# Patient Record
Sex: Female | Born: 1995 | Hispanic: Yes | Marital: Married | Smoking: Never smoker
Health system: Southern US, Community
[De-identification: ages and names within clinical notes are randomized; demographics above are authoritative.]

## PROBLEM LIST (undated history)

## (undated) DIAGNOSIS — Z862 Personal history of diseases of the blood and blood-forming organs and certain disorders involving the immune mechanism: Secondary | ICD-10-CM

## (undated) HISTORY — DX: Personal history of diseases of the blood and blood-forming organs and certain disorders involving the immune mechanism: Z86.2

## (undated) HISTORY — PX: NO PAST SURGERIES: SHX2092

---

## 2016-11-10 LAB — OB RESULTS CONSOLE HGB/HCT, BLOOD
HEMATOCRIT: 34
Hemoglobin: 11.5

## 2016-11-10 LAB — OB RESULTS CONSOLE PLATELET COUNT: Platelets: 249

## 2016-11-18 ENCOUNTER — Ambulatory Visit (INDEPENDENT_AMBULATORY_CARE_PROVIDER_SITE_OTHER): Payer: Medicaid Other | Admitting: Advanced Practice Midwife

## 2016-11-18 ENCOUNTER — Other Ambulatory Visit (HOSPITAL_COMMUNITY)
Admission: RE | Admit: 2016-11-18 | Discharge: 2016-11-18 | Disposition: A | Discharge status: Home / Self Care | Payer: Medicaid Other | Source: Ambulatory Visit | Attending: Advanced Practice Midwife | Admitting: Advanced Practice Midwife

## 2016-11-18 ENCOUNTER — Encounter: Payer: Self-pay | Admitting: Advanced Practice Midwife

## 2016-11-18 VITALS — BP 131/55 | HR 50 | Ht 65.0 in | Wt 146.0 lb

## 2016-11-18 DIAGNOSIS — O219 Vomiting of pregnancy, unspecified: Secondary | ICD-10-CM

## 2016-11-18 DIAGNOSIS — Z3401 Encounter for supervision of normal first pregnancy, first trimester: Secondary | ICD-10-CM | POA: Diagnosis not present

## 2016-11-18 DIAGNOSIS — Z3491 Encounter for supervision of normal pregnancy, unspecified, first trimester: Secondary | ICD-10-CM | POA: Diagnosis present

## 2016-11-18 DIAGNOSIS — Z3400 Encounter for supervision of normal first pregnancy, unspecified trimester: Secondary | ICD-10-CM

## 2016-11-18 DIAGNOSIS — Z862 Personal history of diseases of the blood and blood-forming organs and certain disorders involving the immune mechanism: Secondary | ICD-10-CM

## 2016-11-18 DIAGNOSIS — Z603 Acculturation difficulty: Secondary | ICD-10-CM

## 2016-11-18 DIAGNOSIS — Z3490 Encounter for supervision of normal pregnancy, unspecified, unspecified trimester: Secondary | ICD-10-CM

## 2016-11-18 DIAGNOSIS — Z789 Other specified health status: Secondary | ICD-10-CM

## 2016-11-18 MED ORDER — ONDANSETRON HCL 8 MG PO TABS: 8.0000 mg | ORAL_TABLET | Freq: Three times a day (TID) | ORAL | 3 refills | Status: DC | PRN

## 2016-11-18 NOTE — Progress Notes (Signed)
   PRENATAL VISIT NOTE  Subjective:  Megan Mckay is a 21 y.o. G1P0 at [redacted]w[redacted]d being seen today for transfer of care from [facility] for history of Henoch-Schonlein purpura. Prenatal Records reveiwed. Does nto have records for care for Henoch-Schonlein purpura. Had one episode 4 years ago in [place]. Does not think she can obtain records. Doesn't know what tests were run. She is currently monitored for the following issues for this low-risk pregnancy and has Supervision of normal first pregnancy, antepartum; Language barrier affecting health care; History of Henoch-Schonlein purpura; First pregnancy in adolescent 11 years of age or older, antepartum; and Nausea/vomiting in pregnancy on her problem list.  Patient reports nausea and vomiting. Phenergan helps, but causes too much drowsiness.  . Vag. Bleeding: None.   . Denies leaking of fluid.   The following portions of the patient's history were reviewed and updated as appropriate: allergies, current medications, past family history, past medical history, past social history, past surgical history and problem list. Problem list updated.  Objective:   Vitals:   11/18/16 0929 11/18/16 0930  BP: (!) 131/55   Pulse: (!) 50   Weight: 146 lb (66.2 kg)   Height:   (1.651 m)    Fetal Status: Fetal Heart Rate (bpm): 146 Fundal Height: 14 cm       General:  Alert, oriented and cooperative. Patient is in no acute distress.  Skin: Skin is warm and dry. No rash noted.   Cardiovascular: Normal heart rate noted  Respiratory: Normal respiratory effort, no problems with respiration noted  Abdomen: Soft, gravid, appropriate for gestational age.  Pain/Pressure: Absent     Pelvic: Cervical exam deferred        Extremities: Normal range of motion.  Edema: None  Mental Status:  Normal mood and affect. Normal behavior. Normal judgment and thought content.   Assessment and Plan:  Pregnancy: G1P0 at [redacted]w[redacted]d  1. Prenatal care, antepartum  -  Obstetric Panel, Including HIV - [place] MFM OB DETAIL +14 WK; Future - GC/Chlamydia probe amp ([facility])not at [facility] - CULTURE, URINE COMPREHENSIVE  2. Supervision of normal first pregnancy, antepartum   3. Language barrier affecting health care - Signed refusal of interpreter form. Wants to use friend for interpreting. Reminded that spanish interpreter is always available by phone/video or can be scheduled.   4. History of Henoch-Schonlein purpura AKA IgA Vasculitis.  - Per Up-to-date prognosis is usually excellent. Rarely recurs. Main long-term complication is renal disease. Pregnant women w/ Hx of IgAV have increased risk of HTN. Kidney function and BP should be monitored afterward. Pt does not think she had follow-up testing.  Creatinine at [facility]  PCR, Watch BP  5. First pregnancy in adolescent 38 years of age or older, antepartum   6. Nausea/vomiting in pregnancy  - ondansetron (ZOFRAN) 8 MG tablet; Take 1 tablet (8 mg total) by mouth every 8 (eight) hours as needed for nausea or vomiting.  Dispense: 30 tablet; Refill: 3  Preterm labor symptoms and general obstetric precautions including but not limited to vaginal bleeding, contractions, leaking of fluid and fetal movement were reviewed in detail with the patient. Please refer to After Visit Summary for other counseling recommendations.  Anatomy scan ordered.  Plan quad screen at [facility].  Return in about 4 weeks (around 12/16/2016) for ROB.   Dorathy Kinsman, CNM

## 2016-11-18 NOTE — Patient Instructions (Signed)
Crecimiento del beb durante el embarazo (How a Baby Grows During Pregnancy) El embarazo comienza cuando el semen de un hombre ingresa al vulo de una mujer (fecundacin). Esto ocurre en una de las trompas de Falopio que conecta los ovarios con el tero. Al vulo fecundado se lo denomina embrin hasta que alcanza las 10semanas. A partir de las 10semanas y hasta el momento del parto, se llama feto. El vulo fecundado se desplaza por la trompa de Falopio hasta llegar al tero y luego se implanta en el endometrio y empieza crecer. El feto en crecimiento recibe oxgeno y nutrientes a travs del torrente sanguneo de la embarazada y de los tejidos que se forman (placenta) para la sustentacin fetal. La placenta es el sistema de sustentacin de la vida del feto, proporciona la nutricin y elimina los desechos. Informarse tanto como pueda sobre el embarazo y la forma en que se desarrolla el beb puede ayudarla a disfrutar de la experiencia, y, adems, a que se d cuenta de cundo puede haber un problema y cundo hacer preguntas. CUNTO DURA UN EMBARAZO NORMAL? Generalmente, el embarazo dura 280das, o unas 40semanas. Se divide tres trimestres:  Primer trimestre: desde la semana0 a la13.  Segundo trimestre: desde la semana14 a la27.  Tercer trimestre: desde la semana28 a la40. El da que se considera que el beb est listo para nacer (a trmino) es la fecha prevista de parto. CMO SE DESARROLLA EL BEB MES A MES? Primer mes  El vulo fecundado se implanta dentro del tero.  Algunas clulas formarn la placenta, y otras formarn el feto.  Empiezan a desarrollarse los brazos, las piernas, la mdula espinal, los pulmones y el corazn.  Al final del primer mes, el corazn comienza a latir. Segundo mes  Se forman los huesos, el odo interno, los prpados, las manos y los pies.  Se desarrollan los genitales.  Al final de las 8semanas, todos los rganos importantes estn en  desarrollo. Tercer mes  Se estn formando todos los rganos internos.  Se forman los dientes debajo de las encas.  Empiezan a crecer los huesos y los msculos. La columna vertebral tiene movimiento de flexin.  La piel es transparente.  Empiezan a formarse las uas de las manos y de los pies.  Los brazos y las piernas siguen alargndose, y se desarrollan las manos y los pies.  El feto mide aproximadamente 3pulgadas (7,6cm) de largo. Cuarto mes  La placenta est totalmente formada.  Se han formado los rganos sexuales externos, el cuello, las orejas, las cejas, los prpados y las uas de las manos.  El feto puede or, tragar y mover los brazos y las piernas.  Los riones empiezan a producir orina.  La piel est recubierta por una sustancia sebcea blanca (unto sebceo) y un vello muy fino (lanugo). Quinto mes  El feto se mueve ms y es posible sentirlo por primera vez (da pataditas).  Empieza a dormir y despertarse, y tal vez comience a chuparse el dedo.  Crecen las uas en las puntas de los dedos.  Funciona el rgano del sistema digestivo que produce bilis (vescula biliar) y ayuda a digerir los nutrientes.  Si el beb es nia, tiene vulos en los ovarios. Si el beb es varn, los testculos empiezan a descender hasta el escroto. Sexto mes  Se han formado los pulmones, pero el feto an no puede respirar.  Los ojos se abren. El cerebro sigue desarrollndose.  El beb tiene huellas en los dedos de las manos y   los pies. El cabello del beb se vuelve ms abundante.  A fines del segundo trimestre, el feto mide aproximadamente 9pulgadas (22,9cm) de largo. Sptimo mes  El feto patea y se estira.  Los ojos se han desarrollado lo suficiente como para percibir los cambios de luz.  Las manos pueden hacer movimientos de prensin.  El feto responde a los ruidos. Octavo mes  Todos los rganos, as como los sistemas y aparatos del organismo, estn totalmente  desarrollados y en funcionamiento.  Los huesos se solidifican, y se desarrollan los botones gustativos. Es posible que el feto tenga hipo.  Determinadas regiones del cerebro an se estn desarrollando. El crneo sigue siendo blando. Noveno mes  El feto aumenta aproximadamente libra (230g) cada semana.  Los pulmones estn totalmente desarrollados.  Se desarrollan los hbitos de sueo.  Generalmente, el feto se acomoda con la cabeza hacia abajo (presentacin ceflica de vrtice) en el tero para prepararse para el parto. En cambio, si los glteos se acomodan en esta posicin, el beb est de nalgas.  El feto pesa entre 6 y 9libras (2,72 y 4,08kg) y mide entre 19 y 20pulgadas (48,26 a 50,8cm) de largo. QU PUEDO HACER PARA QUE EL EMBARAZO SEA SANO Y PARA AYUDAR AL BEB A DESARROLLARSE? Comida y bebida  Consuma una dieta saludable. ? Hable con el mdico para asegurarse de que est recibiendo los nutrientes que usted y el beb necesitan. ? Visite www.choosemyplate.gov para obtener ms informacin sobre cmo crear una dieta saludable.  El mdico le aconsejar cul es la cantidad saludable de peso a aumentar durante el embarazo, por lo general, entre 25 y 35libras (11 y 16kg). Puede ser necesario que: ? Aumente ms si tena bajo peso antes de quedar embarazada o si est embarazada de ms de un beb. ? Aumente menos si tena sobrepeso u obesidad cuando qued embarazada. Medicamentos y vitaminas  Tome las vitaminas prenatales como se lo haya indicado el mdico, entre ellas, cido flico, hierro, calcio y vitaminaD, que son importantes para el desarrollo saludable.  Tome los medicamentos solamente como se lo haya indicado el mdico. Lea las etiquetas y consulte al farmacutico o al mdico si puede tomar medicamentos de venta libre, suplementos y medicamentos recetados durante el embarazo. Actividades  Haga actividad fsica como se lo haya aconsejado el mdico. Pdale al mdico que  le recomiende actividades que sean seguras para usted, como caminar o practicar natacin.  No participe en deportes extremos ni extenuantes. Estilo de vida  No beba alcohol.  No consuma ningn producto que contenga tabaco, lo que incluye cigarrillos, tabaco de mascar o cigarrillos electrnicos. Si necesita ayuda para dejar de fumar, consulte al mdico.  No consuma drogas. Seguridad  No se exponga al mercurio, al plomo ni a otros metales pesados. Pregntele al mdico acerca de las fuentes comunes de estos metales pesados.  Evite la infeccin por listeria durante el embarazo. Tome las siguientes precauciones: ? No coma quesos blandos ni fiambres. ? No coma perros calientes, salvo que hayan sido calentados al punto de emitir vapor, por ejemplo, en el microondas. ? No tome leche no pasteurizada.  Evite la infeccin por toxoplasmosis durante el embarazo. Tome las siguientes precauciones: ? No cambie la arena sanitaria del gato, si tiene uno. Pdale a otra persona que lo haga por usted. ? Use guantes de jardinera mientras trabaja en el jardn. Instrucciones generales  Concurra a todas las visitas de control como se lo haya indicado el mdico. Esto es importante. Estas incluyen las visitas   de cuidado prenatal y las pruebas de deteccin.  Mantenga las enfermedades crnicas bajo control. Trabaje en estrecha colaboracin con el mdico para mantener las enfermedades bajo control, por ejemplo, la diabetes. CMO S SI EL BEB SE EST DESARROLLANDO BIEN? En cada visita de cuidado prenatal, el mdico har varios estudios diferentes para controlar su estado de salud y hacer un seguimiento del desarrollo del beb. Estos incluyen los siguientes:  Altura uterina. ? El mdico le medir el vientre en crecimiento desde la parte superior a la inferior con una cinta mtrica. ? Adems, le palpar el vientre para determinar la posicin del beb.  Latido cardaco. ? Una ecografa realizada en el primer  trimestre puede confirmar el embarazo y mostrar un latido cardaco, dependiendo del tiempo de gestacin. ? El mdico controlar la frecuencia cardaca del beb en cada visita de cuidado prenatal. ? A medida que se aproxima la fecha de parto, tal vez se hagan controles habituales de la frecuencia cardaca para garantizar que no haya sufrimiento fetal.  Ecografa del segundo trimestre. ? Esta ecografa controla el desarrollo del beb y tambin indica su sexo. QU DEBO HACER SI TENGO ALGUNA INQUIETUD RESPECTO DEL DESARROLLO DEL BEB? Hable siempre con el mdico si tiene alguna inquietud. Esta informacin no tiene como fin reemplazar el consejo del mdico. Asegrese de hacerle al mdico cualquier pregunta que tenga. Document Released: 07/23/2007 Document Revised: 05/28/2015 Document Reviewed: 07/13/2013 Elsevier Interactive Patient Education  2018 Elsevier Inc.  

## 2016-11-18 NOTE — Progress Notes (Signed)
Interpreter was present but then states she had to leave because she only had one hour to be at our clinic and patient was late arriving. Armandina Stammer RNBSN   Fetal heart rate obtained with ultrasound: 146 BPM. Armandina Stammer RNBSN

## 2016-11-18 NOTE — Addendum Note (Signed)
Addended by: Anell Barr on: 11/18/2016 04:36 PM   Modules accepted: Orders

## 2016-11-18 NOTE — Addendum Note (Signed)
Addended by: Dorathy Kinsman on: 11/18/2016 12:38 PM   Modules accepted: Orders

## 2016-11-19 ENCOUNTER — Encounter: Payer: Self-pay | Admitting: Advanced Practice Midwife

## 2016-11-19 LAB — OBSTETRIC PANEL, INCLUDING HIV
Antibody Screen: NEGATIVE
BASOS ABS: 0 10*3/uL (ref 0.0–0.2)
BASOS: 0 %
EOS (ABSOLUTE): 0 10*3/uL (ref 0.0–0.4)
EOS: 0 %
HEMATOCRIT: 33.7 % — AB (ref 34.0–46.6)
HEMOGLOBIN: 11.2 g/dL (ref 11.1–15.9)
HIV SCREEN 4TH GENERATION: NONREACTIVE
Hepatitis B Surface Ag: NEGATIVE
IMMATURE GRANULOCYTES: 0 %
Immature Grans (Abs): 0 10*3/uL (ref 0.0–0.1)
LYMPHS ABS: 2.9 10*3/uL (ref 0.7–3.1)
Lymphs: 32 %
MCH: 28.6 pg (ref 26.6–33.0)
MCHC: 33.2 g/dL (ref 31.5–35.7)
MCV: 86 fL (ref 79–97)
Monocytes Absolute: 0.5 10*3/uL (ref 0.1–0.9)
Monocytes: 5 %
NEUTROS ABS: 5.6 10*3/uL (ref 1.4–7.0)
NEUTROS PCT: 63 %
Platelets: 276 10*3/uL (ref 150–379)
RBC: 3.91 x10E6/uL (ref 3.77–5.28)
RDW: 15.2 % (ref 12.3–15.4)
RH TYPE: POSITIVE
RPR: NONREACTIVE
Rubella Antibodies, IGG: 5.07 index (ref >0.99)
WBC: 9 10*3/uL (ref 3.4–10.8)

## 2016-11-19 LAB — PROTEIN / CREATININE RATIO, URINE
Creatinine, Urine: 65.2 mg/dL
PROTEIN/CREAT RATIO: 186 mg/g{creat} (ref 0–200)
Protein, Ur: 12.1 mg/dL

## 2016-11-20 LAB — GC/CHLAMYDIA PROBE AMP (~~LOC~~) NOT AT ARMC
Chlamydia: NEGATIVE
NEISSERIA GONORRHEA: NEGATIVE

## 2016-11-26 LAB — CULTURE, URINE COMPREHENSIVE

## 2016-12-15 ENCOUNTER — Encounter (HOSPITAL_COMMUNITY): Payer: Self-pay | Admitting: Advanced Practice Midwife

## 2016-12-19 ENCOUNTER — Ambulatory Visit (INDEPENDENT_AMBULATORY_CARE_PROVIDER_SITE_OTHER): Payer: Medicaid Other | Admitting: Family Medicine

## 2016-12-19 VITALS — BP 112/65 | HR 79 | Wt 153.0 lb

## 2016-12-19 DIAGNOSIS — Z3490 Encounter for supervision of normal pregnancy, unspecified, unspecified trimester: Secondary | ICD-10-CM

## 2016-12-19 DIAGNOSIS — Z789 Other specified health status: Secondary | ICD-10-CM

## 2016-12-19 DIAGNOSIS — Z862 Personal history of diseases of the blood and blood-forming organs and certain disorders involving the immune mechanism: Secondary | ICD-10-CM

## 2016-12-19 NOTE — Progress Notes (Signed)
   PRENATAL VISIT NOTE  Subjective:  Megan Mckay is a 21 y.o. G1P0 at 5255w1d being seen today for ongoing prenatal care.  She is currently monitored for the following issues for this high-risk pregnancy and has Supervision of normal first pregnancy, antepartum; Language barrier affecting health care; History of Henoch-Schonlein purpura; First pregnancy in adolescent 20 years of age or older, antepartum; and Nausea/vomiting in pregnancy on her problem list.  Patient reports no complaints. Nausea improved.   . Vag. Bleeding: None.   . Denies leaking of fluid.   The following portions of the patient's history were reviewed and updated as appropriate: allergies, current medications, past family history, past medical history, past social history, past surgical history and problem list. Problem list updated.  Objective:   Vitals:   12/19/16 0845  BP: 112/65  Pulse: 79  Weight: 153 lb (69.4 kg)    Fetal Status: Fetal Heart Rate (bpm): 155         General:  Alert, oriented and cooperative. Patient is in no acute distress.  Skin: Skin is warm and dry. No rash noted.   Cardiovascular: Normal heart rate noted  Respiratory: Normal respiratory effort, no problems with respiration noted  Abdomen: Soft, gravid, appropriate for gestational age.  Pain/Pressure: Absent     Pelvic: Cervical exam deferred        Extremities: Normal range of motion.  Edema: None  Mental Status:  Normal mood and affect. Normal behavior. Normal judgment and thought content.   Assessment and Plan:  Pregnancy: G1P0 at 5055w1d  1. Prenatal care, antepartum FHT and FH normal. US scheduled for 11/6. - Urine Culture - AFP TETRA - Comprehensive metabolic panel  2. History of Henoch-Schonlein purpura CMP today. UPC normal.  3. Language barrier affecting health care Interpreter used  Preterm labor symptoms and general obstetric precautions including but not limited to vaginal bleeding, contractions, leaking of  fluid and fetal movement were reviewed in detail with the patient. Please refer to After Visit Summary for other counseling recommendations.  No Follow-up on file.   Levie HeritageJacob J Stinson, DO

## 2016-12-21 LAB — URINE CULTURE

## 2016-12-23 ENCOUNTER — Other Ambulatory Visit: Payer: Self-pay | Admitting: Advanced Practice Midwife

## 2016-12-23 ENCOUNTER — Ambulatory Visit (HOSPITAL_COMMUNITY)
Admission: RE | Admit: 2016-12-23 | Discharge: 2016-12-23 | Disposition: A | Discharge status: Home / Self Care | Payer: Medicaid Other | Source: Ambulatory Visit | Attending: Advanced Practice Midwife | Admitting: Advanced Practice Midwife

## 2016-12-23 ENCOUNTER — Encounter (HOSPITAL_COMMUNITY): Payer: Self-pay

## 2016-12-23 DIAGNOSIS — Z363 Encounter for antenatal screening for malformations: Secondary | ICD-10-CM | POA: Diagnosis present

## 2016-12-23 DIAGNOSIS — Z3490 Encounter for supervision of normal pregnancy, unspecified, unspecified trimester: Secondary | ICD-10-CM

## 2016-12-23 DIAGNOSIS — Z3A18 18 weeks gestation of pregnancy: Secondary | ICD-10-CM

## 2016-12-23 DIAGNOSIS — Z8489 Family history of other specified conditions: Secondary | ICD-10-CM | POA: Insufficient documentation

## 2016-12-23 LAB — AFP TETRA
DIA MOM VALUE: 0.57
DIA VALUE (EIA): 95.05 pg/mL
DSR (BY AGE) 1 IN: 1159
DSR (SECOND TRIMESTER) 1 IN: 10000
Gestational Age: 18.1 WEEKS
MATERNAL AGE AT EDD: 20.3 a
MSAFP Mom: 1.32
MSAFP: 56.8 ng/mL
MSHCG MOM: 0.72
MSHCG: 19199 m[IU]/mL
OSB RISK: 4529
T18 (By Age): 1:4517 {titer}
TEST RESULTS AFP: NEGATIVE
WEIGHT: 153 [lb_av]
uE3 Mom: 1.63
uE3 Value: 2.09 ng/mL

## 2016-12-23 LAB — COMPREHENSIVE METABOLIC PANEL
A/G RATIO: 1.4 (ref 1.2–2.2)
ALBUMIN: 4.1 g/dL (ref 3.5–5.5)
ALT: 8 IU/L (ref 0–32)
AST: 15 IU/L (ref 0–40)
Alkaline Phosphatase: 73 IU/L (ref 39–117)
BUN / CREAT RATIO: 16 (ref 9–23)
BUN: 8 mg/dL (ref 6–20)
CHLORIDE: 104 mmol/L (ref 96–106)
CO2: 21 mmol/L (ref 20–29)
Calcium: 9.4 mg/dL (ref 8.7–10.2)
Creatinine, Ser: 0.5 mg/dL — ABNORMAL LOW (ref 0.57–1.00)
GFR, EST AFRICAN AMERICAN: 162 mL/min/{1.73_m2} (ref >59)
GFR, EST NON AFRICAN AMERICAN: 141 mL/min/{1.73_m2} (ref >59)
GLOBULIN, TOTAL: 3 g/dL (ref 1.5–4.5)
Glucose: 69 mg/dL (ref 65–99)
POTASSIUM: 4.4 mmol/L (ref 3.5–5.2)
SODIUM: 140 mmol/L (ref 134–144)
TOTAL PROTEIN: 7.1 g/dL (ref 6.0–8.5)

## 2016-12-24 ENCOUNTER — Telehealth: Payer: Self-pay

## 2016-12-24 DIAGNOSIS — Z3490 Encounter for supervision of normal pregnancy, unspecified, unspecified trimester: Secondary | ICD-10-CM

## 2016-12-24 NOTE — Telephone Encounter (Signed)
Orders placed and called patient to schedule appointments. Armandina StammerJennifer Howard RNBSN

## 2016-12-25 ENCOUNTER — Other Ambulatory Visit: Payer: Self-pay

## 2016-12-25 DIAGNOSIS — Z3490 Encounter for supervision of normal pregnancy, unspecified, unspecified trimester: Secondary | ICD-10-CM

## 2016-12-25 NOTE — Progress Notes (Signed)
Needed to change MFM consult order. Armandina StammerJennifer Nikky Duba RNBSN

## 2017-01-16 ENCOUNTER — Encounter: Payer: Self-pay | Admitting: Family Medicine

## 2017-01-16 ENCOUNTER — Ambulatory Visit (INDEPENDENT_AMBULATORY_CARE_PROVIDER_SITE_OTHER): Payer: Medicaid Other | Admitting: Family Medicine

## 2017-01-16 VITALS — BP 124/60 | HR 84 | Wt 161.0 lb

## 2017-01-16 DIAGNOSIS — F329 Major depressive disorder, single episode, unspecified: Secondary | ICD-10-CM

## 2017-01-16 DIAGNOSIS — Z789 Other specified health status: Secondary | ICD-10-CM

## 2017-01-16 DIAGNOSIS — Z862 Personal history of diseases of the blood and blood-forming organs and certain disorders involving the immune mechanism: Secondary | ICD-10-CM

## 2017-01-16 DIAGNOSIS — O99340 Other mental disorders complicating pregnancy, unspecified trimester: Secondary | ICD-10-CM

## 2017-01-16 DIAGNOSIS — Z3400 Encounter for supervision of normal first pregnancy, unspecified trimester: Secondary | ICD-10-CM

## 2017-01-16 DIAGNOSIS — O99342 Other mental disorders complicating pregnancy, second trimester: Secondary | ICD-10-CM

## 2017-01-16 DIAGNOSIS — Z3402 Encounter for supervision of normal first pregnancy, second trimester: Secondary | ICD-10-CM

## 2017-01-16 DIAGNOSIS — F32A Depression, unspecified: Secondary | ICD-10-CM

## 2017-01-16 NOTE — Progress Notes (Signed)
   PRENATAL VISIT NOTE  Subjective:  Megan Mckay is a 21 y.o. G1P0 at 8425w1d being seen today for ongoing prenatal care.  She is currently monitored for the following issues for this high-risk pregnancy and has Supervision of normal first pregnancy, antepartum; Language barrier affecting health care; History of Henoch-Schonlein purpura; First pregnancy in adolescent 20 years of age or older, antepartum; and Nausea/vomiting in pregnancy on their problem list.  Patient reports feeling depressed. States that she feels sad consistently over the past couple of weeks, although not worsening. Appetite, sleeping habits normal. No SI/HI.Marland Kitchen.  Contractions: Not present. Vag. Bleeding: None.   . Denies leaking of fluid.   The following portions of the patient's history were reviewed and updated as appropriate: allergies, current medications, past family history, past medical history, past social history, past surgical history and problem list. Problem list updated.  Objective:   Vitals:   01/16/17 0932  BP: 124/60  Pulse: 84  Weight: 161 lb (73 kg)    Fetal Status: Fetal Heart Rate (bpm): 145 Fundal Height: 22 cm       General:  Alert, oriented and cooperative. Patient is in no acute distress.  Skin: Skin is warm and dry. No rash noted.   Cardiovascular: Normal heart rate noted  Respiratory: Normal respiratory effort, no problems with respiration noted  Abdomen: Soft, gravid, appropriate for gestational age.  Pain/Pressure: Absent     Pelvic: Cervical exam deferred        Extremities: Normal range of motion.  Edema: None  Mental Status:  Normal mood and affect. Normal behavior. Normal judgment and thought content.   Assessment and Plan:  Pregnancy: G1P0 at 1025w1d  1. Supervision of normal first pregnancy, antepartum FHT and FH normal  2. History of Henoch-Schonlein purpura - Consult to Maternal Fetal Care  3. Language barrier affecting health care Interpreter used  4. Depression  affecting pregnancy, antepartum Pt declined referral to integrative behavioral health. Discussed that if symptoms worsen or if she would like to see Marijean NiemannJaime, then she can call and we can get her an appointment. Patient also thinks that she does not need medication at this time. She does have family support. Also discussed that if she develops SI, then to return or call.  Preterm labor symptoms and general obstetric precautions including but not limited to vaginal bleeding, contractions, leaking of fluid and fetal movement were reviewed in detail with the patient. Please refer to After Visit Summary for other counseling recommendations.  Return in about 4 weeks (around 02/13/2017).   Levie HeritageJacob J Stinson, DO

## 2017-01-21 ENCOUNTER — Encounter (HOSPITAL_COMMUNITY): Payer: Medicaid Other

## 2017-01-21 ENCOUNTER — Ambulatory Visit (HOSPITAL_COMMUNITY): Payer: Medicaid Other

## 2017-01-23 ENCOUNTER — Ambulatory Visit (HOSPITAL_COMMUNITY): Payer: Medicaid Other

## 2017-02-11 ENCOUNTER — Ambulatory Visit (INDEPENDENT_AMBULATORY_CARE_PROVIDER_SITE_OTHER): Payer: Medicaid Other | Admitting: Family Medicine

## 2017-02-11 VITALS — BP 120/81 | HR 74 | Wt 167.0 lb

## 2017-02-11 DIAGNOSIS — M25562 Pain in left knee: Secondary | ICD-10-CM

## 2017-02-11 DIAGNOSIS — Z3400 Encounter for supervision of normal first pregnancy, unspecified trimester: Secondary | ICD-10-CM

## 2017-02-11 DIAGNOSIS — Z3402 Encounter for supervision of normal first pregnancy, second trimester: Secondary | ICD-10-CM

## 2017-02-11 NOTE — Progress Notes (Signed)
   PRENATAL VISIT NOTE  Subjective:  Megan Mckay is a 21 y.o. G1P0 at 513w6d being seen today for ongoing prenatal care.  She is currently monitored for the following issues for this high-risk pregnancy and has Supervision of normal first pregnancy, antepartum; Language barrier affecting health care; History of Henoch-Schonlein purpura; First pregnancy in adolescent 20 years of age or older, antepartum; and Nausea/vomiting in pregnancy on their problem list.  Patient reports knee pain, intermittently when walking around. Had injury to knee previously, is concerned that she is reinjuring it due to weight gain in pregnancy..  Contractions: Not present. Vag. Bleeding: None.  Movement: Present. Denies leaking of fluid.   The following portions of the patient's history were reviewed and updated as appropriate: allergies, current medications, past family history, past medical history, past social history, past surgical history and problem list. Problem list updated.  Objective:   Vitals:   02/11/17 1558  BP: 120/81  Pulse: 74  Weight: 167 lb (75.8 kg)    Fetal Status: Fetal Heart Rate (bpm): 155 Fundal Height: 26 cm Movement: Present     General:  Alert, oriented and cooperative. Patient is in no acute distress.  Skin: Skin is warm and dry. No rash noted.   Cardiovascular: Normal heart rate noted  Respiratory: Normal respiratory effort, no problems with respiration noted  Abdomen: Soft, gravid, appropriate for gestational age.  Pain/Pressure: Absent     Pelvic: Cervical exam deferred        Extremities: Normal range of motion.  Edema: None  MSK: Tenderness to patella manipulation. Knee otherwise stable to varus and valgus motions.  Mental Status:  Normal mood and affect. Normal behavior. Normal judgment and thought content.   Assessment and Plan:  Pregnancy: G1P0 at [redacted]w[redacted]d  1. Supervision of normal first pregnancy, antepartum FHT and FH normal  2. Acute pain of left knee - AMB  referral to sports medicine  Preterm labor symptoms and general obstetric precautions including but not limited to vaginal bleeding, contractions, leaking of fluid and fetal movement were reviewed in detail with the patient. Please refer to After Visit Summary for other counseling recommendations.  Return in about 4 weeks (around 03/11/2017) for 2 hr GTT, OB f/u.   Levie HeritageJacob J Zedric Deroy, DO

## 2017-02-13 ENCOUNTER — Ambulatory Visit (HOSPITAL_COMMUNITY)
Admission: RE | Admit: 2017-02-13 | Discharge: 2017-02-13 | Disposition: A | Discharge status: Home / Self Care | Payer: Medicaid Other | Source: Ambulatory Visit | Attending: Advanced Practice Midwife | Admitting: Advanced Practice Midwife

## 2017-02-13 ENCOUNTER — Encounter (HOSPITAL_COMMUNITY): Payer: Self-pay

## 2017-02-13 DIAGNOSIS — Z362 Encounter for other antenatal screening follow-up: Secondary | ICD-10-CM | POA: Insufficient documentation

## 2017-02-13 DIAGNOSIS — Z840 Family history of diseases of the skin and subcutaneous tissue: Secondary | ICD-10-CM | POA: Insufficient documentation

## 2017-02-13 DIAGNOSIS — Z363 Encounter for antenatal screening for malformations: Secondary | ICD-10-CM | POA: Insufficient documentation

## 2017-02-13 DIAGNOSIS — Z3400 Encounter for supervision of normal first pregnancy, unspecified trimester: Secondary | ICD-10-CM

## 2017-02-13 DIAGNOSIS — Z3A26 26 weeks gestation of pregnancy: Secondary | ICD-10-CM | POA: Diagnosis not present

## 2017-02-13 DIAGNOSIS — Z3490 Encounter for supervision of normal pregnancy, unspecified, unspecified trimester: Secondary | ICD-10-CM

## 2017-02-13 DIAGNOSIS — O219 Vomiting of pregnancy, unspecified: Secondary | ICD-10-CM

## 2017-02-13 DIAGNOSIS — Z862 Personal history of diseases of the blood and blood-forming organs and certain disorders involving the immune mechanism: Secondary | ICD-10-CM

## 2017-02-13 NOTE — Consult Note (Signed)
Maternal Fetal Medicine Consultation  Requesting Provider(s): [facility]  Primary OB: Stinson Reason for consultation: History of Immunoglobulin A vasculitis (Henoch-Schonlein purpura)  HPI: 21yo P0 at 26+1 weeks, referred due to a history of IgA vasculitis as a child. She had the disease at age 21 and has made a full recovery. She states the only manifestation of the disease was the cutaneous purpura and mild arthralgia. She did not have any of the gastrointestinal or renal manifestations of the disease. She has none of the markers for persistent renal disease, which is the only manifestation that affects pregnancy significantly.  OB History: OB History    Gravida Para Term Preterm AB Living   1         0   SAB TAB Ectopic Multiple Live Births                  PMH:  Past Medical History:  Diagnosis Date  . History of Henoch-Schonlein purpura     PSH: No past surgical history on file. Meds: PNV Allergies: NKDA FH: See EPIC section Soc: See EPIC section  Review of Systems: no vaginal bleeding or cramping/contractions, no LOF, no nausea/vomiting. All other systems reviewed and are negative.  PE:  VS: BP  109/67                  pulse  101             Weight 166.8 lb GEN: well-appearing female ABD: gravid, NT  Please see separate document for fetal ultrasound report.  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 Recommend continuing routine prenatal care Continue with routine prenatal care  Thank you for the opportunity to be a part of the care of Megan JunglingJennifer Mckay. Please contact our office if we can be of further assistance.   I spent approximately 15 minutes with this patient with over 50% of time spent in face-to-face counseling.

## 2017-03-13 ENCOUNTER — Ambulatory Visit (INDEPENDENT_AMBULATORY_CARE_PROVIDER_SITE_OTHER): Payer: Medicaid Other | Admitting: Family Medicine

## 2017-03-13 VITALS — BP 107/74 | HR 72 | Wt 170.0 lb

## 2017-03-13 DIAGNOSIS — Z3490 Encounter for supervision of normal pregnancy, unspecified, unspecified trimester: Secondary | ICD-10-CM

## 2017-03-13 DIAGNOSIS — Z3400 Encounter for supervision of normal first pregnancy, unspecified trimester: Secondary | ICD-10-CM

## 2017-03-13 DIAGNOSIS — Z862 Personal history of diseases of the blood and blood-forming organs and certain disorders involving the immune mechanism: Secondary | ICD-10-CM

## 2017-03-13 DIAGNOSIS — G44209 Tension-type headache, unspecified, not intractable: Secondary | ICD-10-CM

## 2017-03-13 DIAGNOSIS — Z789 Other specified health status: Secondary | ICD-10-CM

## 2017-03-13 MED ORDER — CYCLOBENZAPRINE HCL 10 MG PO TABS: 5.0000 mg | ORAL_TABLET | Freq: Three times a day (TID) | ORAL | 1 refills | Status: DC | PRN

## 2017-03-13 NOTE — Progress Notes (Signed)
   PRENATAL VISIT NOTE  Subjective:  Megan Mckay is a 22 y.o. G1P0 at 5611w1d being seen today for ongoing prenatal care.  She is currently monitored for the following issues for this low-risk pregnancy and has Supervision of normal first pregnancy, antepartum; Language barrier affecting health care; History of Henoch-Schonlein purpura; First pregnancy in adolescent 21 years of age or older, antepartum; and Nausea/vomiting in pregnancy on their problem list.  Patient reports headache - gets them twice a week, usually in the morning. Improves with rest and tylenol. Seems to come on when she has stress. Contractions: Not present. Vag. Bleeding: None.  Movement: Present. Denies leaking of fluid.   The following portions of the patient's history were reviewed and updated as appropriate: allergies, current medications, past family history, past medical history, past social history, past surgical history and problem list. Problem list updated.  Objective:   Vitals:   03/13/17 0909  BP: 107/74  Pulse: 72  Weight: 170 lb (77.1 kg)    Fetal Status:     Movement: Present     General:  Alert, oriented and cooperative. Patient is in no acute distress.  Skin: Skin is warm and dry. No rash noted.   Cardiovascular: Normal heart rate noted  Respiratory: Normal respiratory effort, no problems with respiration noted  Abdomen: Soft, gravid, appropriate for gestational age.  Pain/Pressure: Absent     Pelvic: Cervical exam deferred        Extremities: Normal range of motion.  Edema: None  Mental Status:  Normal mood and affect. Normal behavior. Normal judgment and thought content.   Assessment and Plan:  Pregnancy: G1P0 at 5511w1d  1. Prenatal care, antepartum - Glucose Tolerance, 2 Hours w/1 Hour - RPR - HIV antibody (with reflex) - CBC  2. Supervision of normal first pregnancy, antepartum FHT and FH normal  3. History of Henoch-Schonlein purpura No additional testing needed  4.  Language barrier affecting health care Interpreter used  5. Tension headache Flexeril prescribed. Pt warned of somnolence - start low dose.  Preterm labor symptoms and general obstetric precautions including but not limited to vaginal bleeding, contractions, leaking of fluid and fetal movement were reviewed in detail with the patient. Please refer to After Visit Summary for other counseling recommendations.  Return in about 2 weeks (around 03/27/2017).   Levie HeritageJacob J Stinson, DO

## 2017-03-14 LAB — CBC
HEMATOCRIT: 28.1 % — AB (ref 34.0–46.6)
Hemoglobin: 9.2 g/dL — ABNORMAL LOW (ref 11.1–15.9)
MCH: 29.5 pg (ref 26.6–33.0)
MCHC: 32.7 g/dL (ref 31.5–35.7)
MCV: 90 fL (ref 79–97)
Platelets: 305 10*3/uL (ref 150–379)
RBC: 3.12 x10E6/uL — ABNORMAL LOW (ref 3.77–5.28)
RDW: 13.7 % (ref 12.3–15.4)
WBC: 9.1 10*3/uL (ref 3.4–10.8)

## 2017-03-14 LAB — GLUCOSE TOLERANCE, 2 HOURS W/ 1HR
Glucose, 1 hour: 96 mg/dL (ref 65–179)
Glucose, 2 hour: 67 mg/dL (ref 65–152)
Glucose, Fasting: 69 mg/dL (ref 65–91)

## 2017-03-14 LAB — RPR: RPR Ser Ql: NONREACTIVE

## 2017-03-14 LAB — HIV ANTIBODY (ROUTINE TESTING W REFLEX): HIV SCREEN 4TH GENERATION: NONREACTIVE

## 2017-03-16 ENCOUNTER — Other Ambulatory Visit: Payer: Self-pay | Admitting: Family Medicine

## 2017-03-16 ENCOUNTER — Telehealth: Payer: Self-pay

## 2017-03-16 DIAGNOSIS — Z3400 Encounter for supervision of normal first pregnancy, unspecified trimester: Secondary | ICD-10-CM

## 2017-03-16 MED ORDER — FERROUS GLUCONATE 324 (38 FE) MG PO TABS: 324.0000 mg | ORAL_TABLET | Freq: Two times a day (BID) | ORAL | 3 refills | Status: DC

## 2017-03-16 NOTE — Telephone Encounter (Signed)
-----   Message from Levie HeritageJacob J Stinson, DO sent at 03/16/2017  8:28 AM EST ----- Patient quite anemic - needs to take iron twice a day. Prescription sent to pharmacy.

## 2017-03-16 NOTE — Telephone Encounter (Signed)
Attempted to reach patient. Voicemail not set up. Armandina StammerJennifer Cherilyn Sautter RN

## 2017-03-19 NOTE — Telephone Encounter (Signed)
Attempt to contact pt.  No answer, VM not set up. 

## 2017-03-23 NOTE — Telephone Encounter (Signed)
Patient was seen in office and made aware of anemia and need for prescription. Patient states understanding through interpreter. Megan StammerJennifer Raekwan Spelman RNBSN

## 2017-03-25 ENCOUNTER — Encounter: Payer: Self-pay | Admitting: Obstetrics & Gynecology

## 2017-03-25 ENCOUNTER — Ambulatory Visit (INDEPENDENT_AMBULATORY_CARE_PROVIDER_SITE_OTHER): Payer: Medicaid Other | Admitting: Obstetrics & Gynecology

## 2017-03-25 VITALS — BP 104/59 | HR 92 | Wt 174.0 lb

## 2017-03-25 DIAGNOSIS — Z3400 Encounter for supervision of normal first pregnancy, unspecified trimester: Secondary | ICD-10-CM

## 2017-03-25 DIAGNOSIS — Z862 Personal history of diseases of the blood and blood-forming organs and certain disorders involving the immune mechanism: Secondary | ICD-10-CM

## 2017-03-25 DIAGNOSIS — Z789 Other specified health status: Secondary | ICD-10-CM

## 2017-03-25 NOTE — Patient Instructions (Signed)

## 2017-03-25 NOTE — Progress Notes (Signed)
Interpreter [phone] used via Ipad. Armandina StammerJennifer Howard RN BSN

## 2017-03-25 NOTE — Progress Notes (Signed)
   PRENATAL VISIT NOTE  Subjective:  Megan JunglingJennifer Mckay is a 22 y.o. G1P0 at 3433w6d being seen today for ongoing prenatal care.  She is currently monitored for the following issues for this low-risk pregnancy and has Supervision of normal first pregnancy, antepartum; Language barrier affecting health care; History of Henoch-Schonlein purpura; First pregnancy in adolescent 21 years of age or older, antepartum; and Nausea/vomiting in pregnancy on their problem list.  Patient reports no complaints.  Contractions: Not present. Vag. Bleeding: None.  Movement: Present. Denies leaking of fluid.   The following portions of the patient's history were reviewed and updated as appropriate: allergies, current medications, past family history, past medical history, past social history, past surgical history and problem list. Problem list updated.  Objective:   Vitals:   03/25/17 1540  BP: (!) 104/59  Pulse: 92  Weight: 174 lb (78.9 kg)    Fetal Status: Fetal Heart Rate (bpm): 150   Movement: Present     General:  Alert, oriented and cooperative. Patient is in no acute distress.  Skin: Skin is warm and dry. No rash noted.   Cardiovascular: Normal heart rate noted  Respiratory: Normal respiratory effort, no problems with respiration noted  Abdomen: Soft, gravid, appropriate for gestational age.  Pain/Pressure: Absent     Pelvic: Cervical exam deferred        Extremities: Normal range of motion.  Edema: None  Mental Status:  Normal mood and affect. Normal behavior. Normal judgment and thought content.   Assessment and Plan:  Pregnancy: G1P0 at 6233w6d  1. Supervision of normal first pregnancy, antepartum  2. Language barrier affecting health care Entire visit conducted with a video interpreter.   3. History of Henoch-Schonlein purpura See problem list. No f/u needed  Preterm labor symptoms and general obstetric precautions including but not limited to vaginal bleeding, contractions, leaking  of fluid and fetal movement were reviewed in detail with the patient. Please refer to After Visit Summary for other counseling recommendations.  F/u in 2 weeks or sooner prn  interpreter number [phone]  Willodean Rosenthalarolyn Harraway-Smith, MD

## 2017-04-08 ENCOUNTER — Ambulatory Visit (INDEPENDENT_AMBULATORY_CARE_PROVIDER_SITE_OTHER): Payer: Medicaid Other | Admitting: Obstetrics & Gynecology

## 2017-04-08 ENCOUNTER — Encounter: Payer: Self-pay | Admitting: Obstetrics & Gynecology

## 2017-04-08 VITALS — BP 106/56 | HR 63 | Wt 178.0 lb

## 2017-04-08 DIAGNOSIS — Z862 Personal history of diseases of the blood and blood-forming organs and certain disorders involving the immune mechanism: Secondary | ICD-10-CM | POA: Diagnosis not present

## 2017-04-08 DIAGNOSIS — Z789 Other specified health status: Secondary | ICD-10-CM

## 2017-04-08 DIAGNOSIS — Z3403 Encounter for supervision of normal first pregnancy, third trimester: Secondary | ICD-10-CM

## 2017-04-08 DIAGNOSIS — Z603 Acculturation difficulty: Secondary | ICD-10-CM

## 2017-04-08 DIAGNOSIS — O219 Vomiting of pregnancy, unspecified: Secondary | ICD-10-CM | POA: Diagnosis not present

## 2017-04-08 DIAGNOSIS — Z3400 Encounter for supervision of normal first pregnancy, unspecified trimester: Secondary | ICD-10-CM

## 2017-04-08 NOTE — Progress Notes (Signed)
   PRENATAL VISIT NOTE  Subjective:  Megan Mckay is a 22 y.o. G1P0 at 5053w6d being seen today for ongoing prenatal care.  She is currently monitored for the following issues for this low-risk pregnancy and has Supervision of normal first pregnancy, antepartum; Language barrier affecting health care; History of Henoch-Schonlein purpura; First pregnancy in adolescent 21 years of age or older, antepartum; and Nausea/vomiting in pregnancy on their problem list.  Patient reports no complaints.  Contractions: Not present.  .  Movement: Present. Denies leaking of fluid.   The following portions of the patient's history were reviewed and updated as appropriate: allergies, current medications, past family history, past medical history, past social history, past surgical history and problem list. Problem list updated.  Objective:   Vitals:   04/08/17 1535  BP: (!) 106/56  Pulse: 63  Weight: 178 lb (80.7 kg)    Fetal Status: Fetal Heart Rate (bpm): 138   Movement: Present     General:  Alert, oriented and cooperative. Patient is in no acute distress.  Skin: Skin is warm and dry. No rash noted.   Cardiovascular: Normal heart rate noted  Respiratory: Normal respiratory effort, no problems with respiration noted  Abdomen: Soft, gravid, appropriate for gestational age.  Pain/Pressure: Absent     Pelvic: Cervical exam deferred        Extremities: Normal range of motion.  Edema: None  Mental Status:  Normal mood and affect. Normal behavior. Normal judgment and thought content.   Assessment and Plan:  Pregnancy: G1P0 at 653w6d  1. Supervision of normal first pregnancy, antepartum info given on contraception management   2. Language barrier affecting health care interpretor [phone] used for entire visit  3. History of Henoch-Schonlein purpura No sx. S/p MFM consult. No additional tx needed.    Preterm labor symptoms and general obstetric precautions including but not limited to vaginal  bleeding, contractions, leaking of fluid and fetal movement were reviewed in detail with the patient. Please refer to After Visit Summary for other counseling recommendations.  Return in about 2 weeks (around 04/22/2017).  Willodean Rosenthalarolyn Harraway-Smith, MD

## 2017-04-08 NOTE — Patient Instructions (Signed)
Eleccin del mtodo anticonceptivo Contraception Choices La anticoncepcin, o los mtodos anticonceptivos, hace referencia a los mtodos o dispositivos que evitan el [place]. Mtodos hormonales Implante anticonceptivo Un implante anticonceptivo consiste en un tubo plstico delgado que contiene una hormona. Se inserta en la parte superior del brazo. Puede Consulting civil engineer por 3 aos. Inyecciones de progestina sola Las inyecciones de progestina sola contienen progestina, una forma sinttica de la hormona progesterona. Un mdico las administra cada 3 meses. Pldoras anticonceptivas Las pldoras anticonceptivas son pastillas que contienen hormonas que evitan el [place]. Deben tomarse una vez al da, preferentemente a la misma Economist. Parches anticonceptivos El parche anticonceptivo contiene hormonas que evitan el [place]. Se coloca en la piel, debe cambiarse una vez a la semana durante tres semanas y debe retirarse en la cuarta semana. Se necesita una receta para utilizar este mtodo anticonceptivo. Anillo vaginal Un anillo vaginal contiene hormonas que evitan el embarazo. Se coloca en la vagina durante tres semanas y se retira en la cuarta semana. Luego se repite el proceso con un anillo nuevo. Se necesita una receta para utilizar este mtodo anticonceptivo. Anticonceptivo de Associate Professor Los anticonceptivos de emergencia evitan el embarazo despus de Warehouse manager sexo sin proteccin. Vienen en forma de pldora y pueden tomarse hasta 5 das despus de [place]. Funcionan mejor cuando se toman lo ms pronto posible luego de [employer]. La mayora de los anticonceptivos de emergencia estn disponibles sin receta mdica. Este mtodo no debe utilizarse como el nico mtodo anticonceptivo. Mtodos de barrera Preservativo masculino Un preservativo masculino es una vaina delgada que se coloca sobre el pene durante el sexo. Los preservativos evitan que el esperma ingrese en el cuerpo de la  [place]. Pueden utilizarse con un espermicida para aumentar la efectividad. Deben desecharse luego de su uso. Preservativo femenino Un preservativo femenino es una vaina blanda y holgada que se coloca en la vagina antes de [place]. El preservativo evita que el esperma ingrese en el cuerpo de la [place]. Deben desecharse luego de su uso. Diafragma Un diafragma es una barrera blanda con forma de cpula. Se inserta en la vagina antes del sexo, junto con un espermicida. El diafragma bloquea el ingreso de esperma en el tero, y el espermicida mata a los espermatozoides. El Designer, fashion/clothing en la vagina durante 6 a 8 horas despus de Warehouse manager sexo y debe retirarse en el plazo de las 24 horas. Un diafragma es recetado y colocado por un mdico. Debe reemplazarse cada 1 a 2 aos, despus de dar a luz, de aumentar ms de 15lb (6.8kg) y de [place] plvica. Capuchn cervical Un capuchn cervical es una copa redonda y blanda de ltex o plstico que se coloca en el cuello uterino. Se inserta en la vagina antes del sexo, junto con un espermicida. Bloquea el ingreso del esperma en el tero. El capuchn Radio producer durante 6 a 8 horas despus de Warehouse manager sexo y debe retirarse en el plazo de las 48 horas. Un capuchn cervical debe ser recetado y colocado por un mdico. Debe reemplazarse cada 2aos. Esponja Una esponja es una pieza blanda y circular de espuma de poliuretano que contiene espermicida. La esponja ayuda a bloquear el ingreso de esperma en el tero, y el espermicida mata a los espermatozoides. Belva Bertin, debe humedecerla e insertarla en la vagina. Debe insertarse antes de eBay, debe permanecer dentro al menos durante 6 horas despus de [place] sexo y debe retirarse y Nurse, adult en  el plazo de las 30 horas. Espermicidas Los espermicidas son sustancias qumicas que matan o bloquean al esperma y no lo dejan ingresar al cuello uterino y al tero. Vienen en forma de crema, gel,  supositorio, espuma o comprimido. Un espermicida debe insertarse en la vagina con un aplicador al menos 10 o 15 minutos antes de tener sexo para dar tiempo a que surta [place] Eliz[place]ethefecto. El proceso debe repetirse cada vez que tenga sexo. Los espermicidas no requieren Emergency planning/management officerreceta mdica. Anticonceptivos intrauterinos Dispositivo intrauterino (DIU). Un DIU un dispositivo en forma de T que se coloca en el tero. Hay dos tipos:  DIU hormonal. Este tipo contiene progestina, una forma sinttica de la hormona progesterona. Este tipo puede permanecer colocado durante 3 a 5 aos.  DIU de cobre. Este tipo est recubierto con un alambre de cobre. Puede permanecer colocado durante 10 aos.  Mtodos anticonceptivos permanentes Ligadura de trompas en la mujer En este mtodo, se sellan, atan u obstruyen las trompas de Falopio durante una ciruga para Automotive engineerevitar que el vulo descienda Gertonhacia el tero. Esterilizacin histeroscpica En este mtodo, se coloca un implante pequeo y flexible dentro de cada trompa de Falopio. Los implantes hacen que se forme un tejido cicatricial en las trompas de Falopio y que las obstruya para que el espermatozoide no pueda llegar al vulo. El procedimiento demora alrededor de 3 meses para que sea Nobleefectivo. Debe utilizarse otro mtodo anticonceptivo durante esos 3 meses. Esterilizacin masculina Este es un procedimiento que consiste en atar los conductos que transportan el esperma (vasectoma). Luego del procedimiento, el hombre Manufacturing engineerpuede eyacular lquido (semen). Mtodos de planificacin natural Planificacin familiar natural En este mtodo, la pareja no tiene American Family Insurancesexo durante los das en que la mujer podra quedar [place]embarazada. Mtodo calendario Marketing executivesto significa realizar un seguimiento de la duracin de cada ciclo menstrual, identificar los Becton, Dickinson and Companydas en los que se puede producir un Psychiatristembarazo y no Warehouse managertener sexo durante esos Mind[place]. Mtodo de la ovulacin En este mtodo, la pareja evita tener sexo durante la  ovulacin. Mtodo sintotrmico Este mtodo implica no tener sexo durante la ovulacin. Normalmente, la mujer comprueba la ovulacin al observar cambios en su temperatura y en la consistencia del moco cervical. Mtodo posovulacin En este mtodo, la pareja espera a que finalice la ovulacin para Doctor, hospitaltener sexo. Resumen  La anticoncepcin, o los mtodos anticonceptivos, hace referencia a los mtodos o dispositivos que evitan el [place] Villageembarazo.  Los mtodos anticonceptivos hormonales incluyen implantes, inyecciones, pastillas, parches, anillos vaginales y anticonceptivos de Associate Professoremergencia.  Los mtodos anticonceptivos de barrera pueden incluir preservativos masculinos, preservativos femeninos, diafragmas, capuchones cervicales, esponjas y espermicidas.  Hay dos tipos de DIU (dispositivos intrauterinos). Un DIU puede colocarse en el tero de una mujer para evitar el embarazo durante 3 a 5 aos.  La esterilizacin permanente puede realizarse mediante un procedimiento para hombres, mujeres o ambos.  Los The Krogermtodos de Medical sales representativeplanificacin familiar natural incluyen no tener American Family Insurancesexo durante los das en que la mujer podra quedar [place] Be[place]mbarazada. Esta informacin no tiene Theme park managercomo fin reemplazar el consejo del mdico. Asegrese de hacerle al mdico cualquier pregunta que tenga. Document Released: 02/03/2005 Document Revised: 05/26/2016 Document Reviewed: 05/26/2016 Elsevier Interactive Patient Education  2018 [employer]

## 2017-04-22 ENCOUNTER — Encounter: Payer: Medicaid Other | Admitting: Obstetrics & Gynecology

## 2017-05-26 ENCOUNTER — Telehealth: Payer: Self-pay

## 2017-05-26 NOTE — Telephone Encounter (Signed)
Attempted to reach patient because she has not been seen for prenatal care since Apr 08, 2017. Patients due date was 05-21-17.  When dialing the number in chart it states "voicemailbox not set up". Armandina StammerJennifer Skyelar Swigart RN

## 2017-08-13 ENCOUNTER — Encounter (HOSPITAL_COMMUNITY): Payer: Self-pay

## 2019-07-16 IMAGING — US US MFM OB FOLLOW-UP
1 series · 14 of 28 positions shown · non-contrast
Comparison: none

[Series 1: us mfm ob follow-up · 38 acquisitions, 14 frames shown]
[im 2/38]
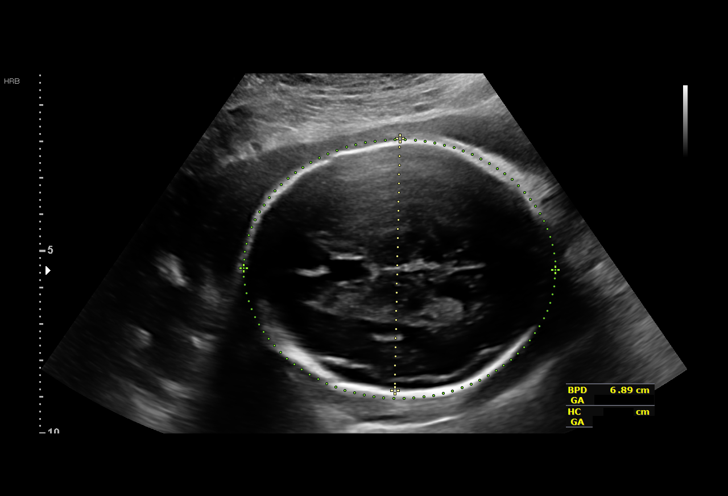
[im 5/38]
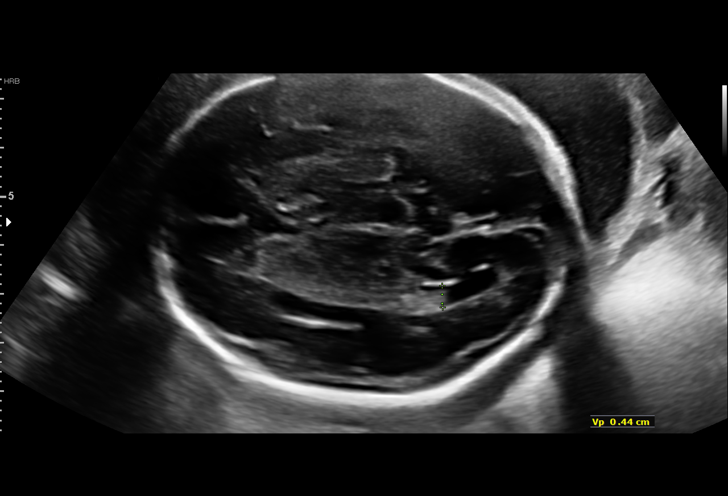
[im 7/38]
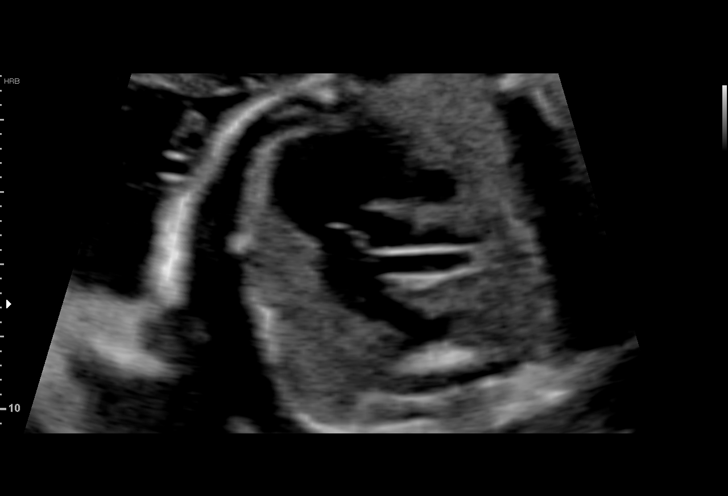
[im 10/38]
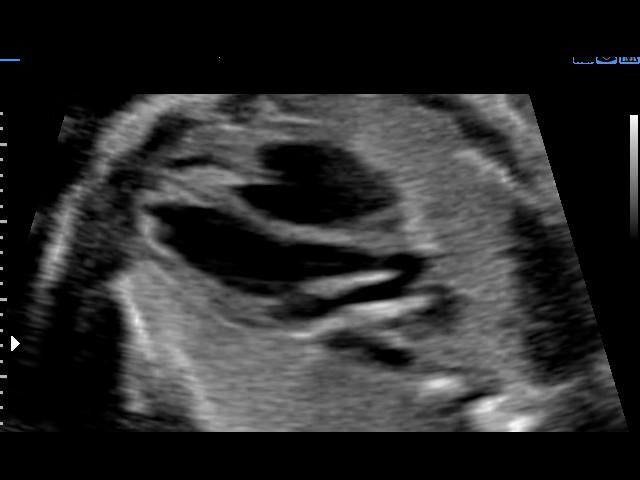
[im 13/38]
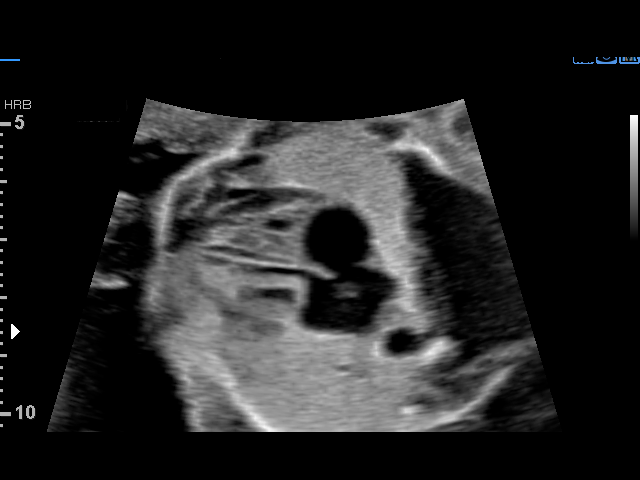
[im 16/38]
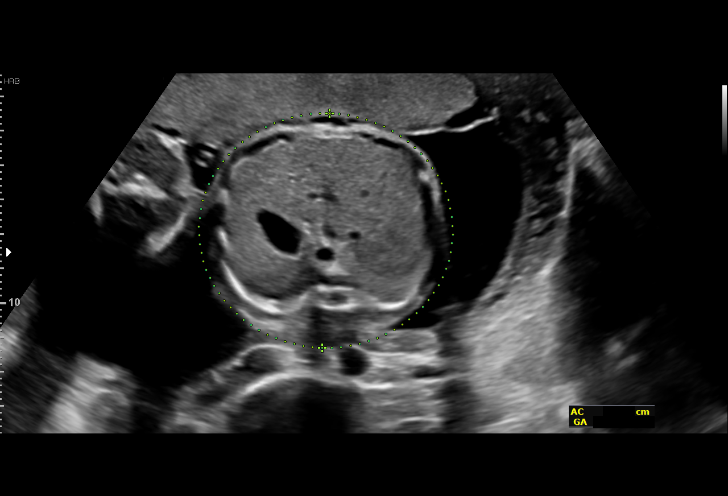
[im 18/38]
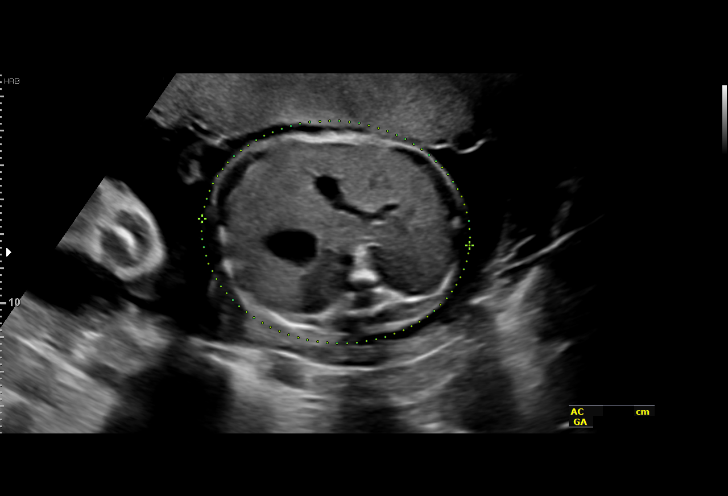
[im 21/38]
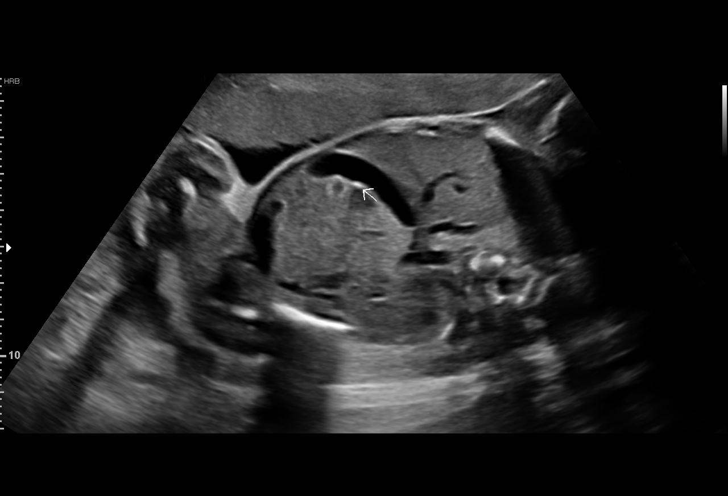
[im 24/38]
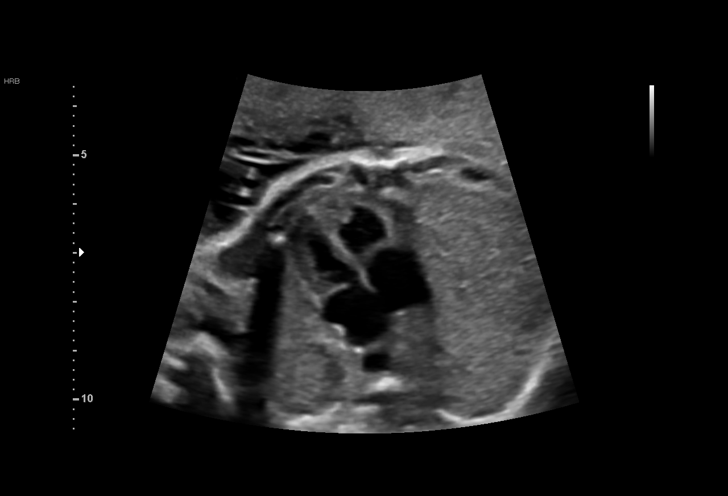
[im 27/38]
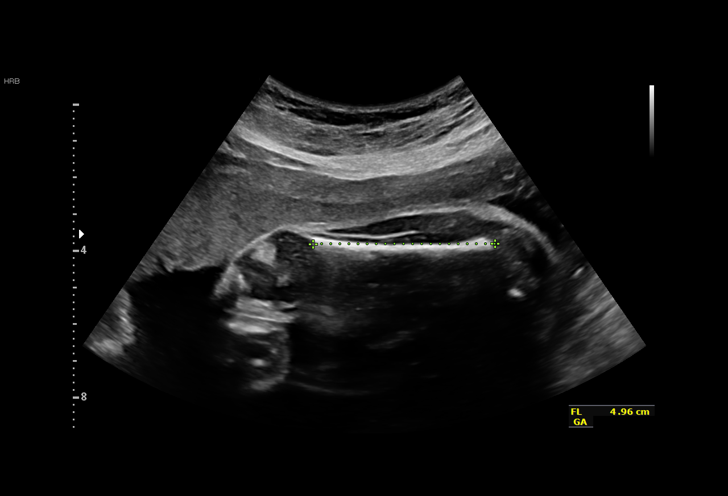
[im 29/38]
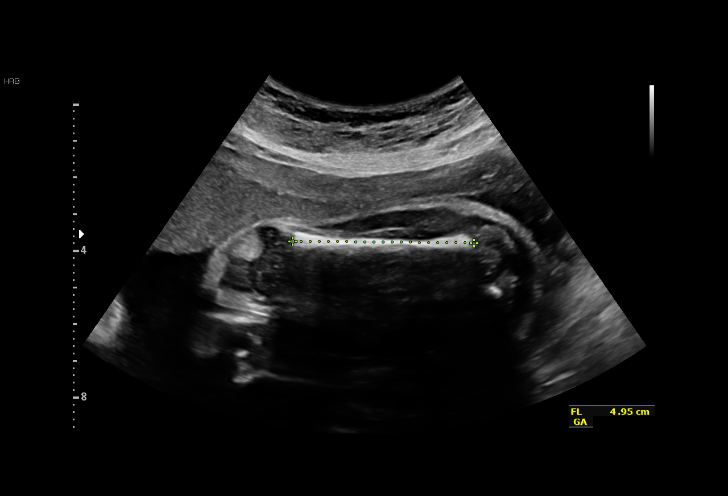
[im 32/38]
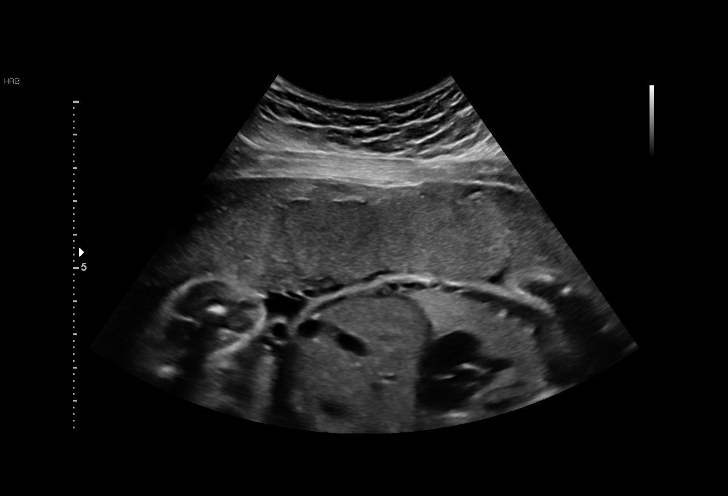
[im 35/38]
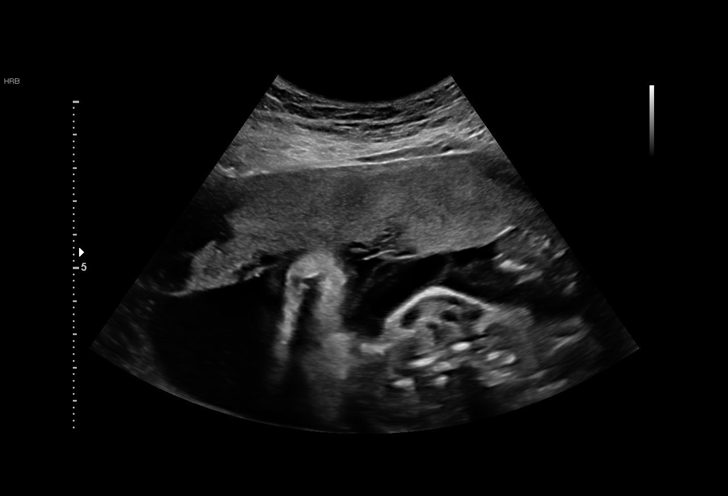
[im 38/38]
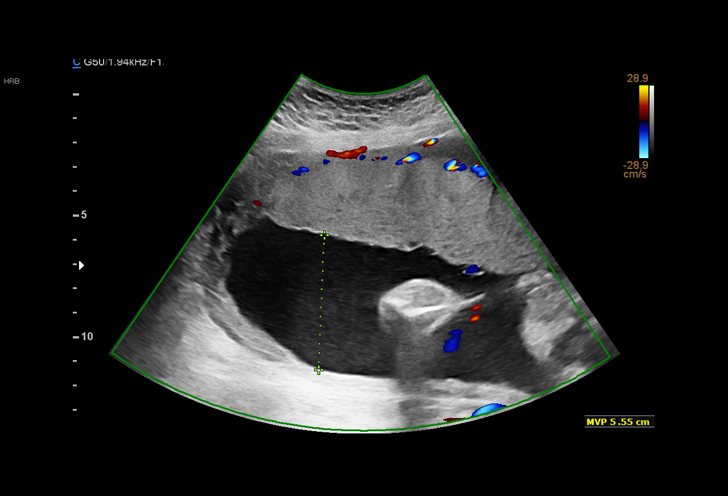

[14 of 28 positions shown; findings below may reference images not displayed]

1  ANSELIHO KRISTE           [id number]      [id number]     [id number]
Indications

26 weeks gestation of pregnancy
Encounter for antenatal screening for
malformations
Family history of genetic disorder (Filej
Mohini Galo)
OB History

Gravidity:    1
Fetal Evaluation

Num Of Fetuses:     1
Fetal Heart         149
Rate(bpm):
Cardiac Activity:   Observed
Presentation:       Cephalic
Placenta:           Anterior, above cervical os
P. Cord Insertion:  Previously Visualized

Amniotic Fluid
AFI FV:      Subjectively within normal limits

Largest Pocket(cm)
5.55
Biometry

BPD:      68.9  mm     G. Age:  27w 5d         87  %    CI:        76.65   %    70 - 86
FL/HC:      20.0   %    18.6 -
HC:      249.3  mm     G. Age:  27w 0d         57  %    HC/AC:      1.11        1.04 -
AC:       224   mm     G. Age:  26w 6d         62  %    FL/BPD:     72.3   %    71 - 87
FL:       49.8  mm     G. Age:  26w 6d         57  %    FL/AC:      22.2   %    20 - 24
HUM:      45.2  mm     G. Age:  26w 5d         60  %

Est. FW:     998  gm      2 lb 3 oz     69  %
Gestational Age

LMP:           26w 1d        Date:  08/14/16                 EDD:   05/21/17
U/S Today:     27w 1d                                        EDD:   05/14/17
Best:          26w 1d     Det. By:  LMP  (08/14/16)          EDD:   05/21/17
Anatomy

Cranium:               Appears normal         Aortic Arch:            Previously seen
Cavum:                 Appears normal         Ductal Arch:            Previously seen
Ventricles:            Appears normal         Diaphragm:              Appears normal
Choroid Plexus:        Previously seen        Stomach:                Appears normal, left
sided
Cerebellum:            Previously seen        Abdomen:                Appears normal
Posterior Fossa:       Previously seen        Abdominal Wall:         Previously seen
Nuchal Fold:           Previously seen        Cord Vessels:           Previously seen
Face:                  Profile nl; orbits     Kidneys:                Appear normal
previously seen
Lips:                  Previously seen        Bladder:                Appears normal
Thoracic:              Appears normal         Spine:                  Not well visualized
Heart:                 Appears normal         Upper Extremities:      Previously seen
(4CH, axis, and situs
RVOT:                  Appears normal         Lower Extremities:      Previously seen
LVOT:                  Appears normal

Other:  Fetus appears to be a male. Heels and Lt 5th digit previously seen.
Technically difficult due to fetal position.
Cervix Uterus Adnexa

Cervix
Length:           3.64  cm.
Normal appearance by transabdominal scan.
Impression

Singleton intrauterine pregnancy at 26+1 weeks
Interval review of the anatomy shows no sonographic
markers for aneuploidy or structural anomalies
All relevant fetal anatomy has been visualized
Amniotic fluid volume is normal
Estimated fetal weight is 998g which is growth in the 69th
percentile
Recommendations

Follow-up ultrasounds as clinically indicated.

## 2021-08-27 ENCOUNTER — Encounter: Payer: Self-pay | Admitting: General Practice

## 2021-10-09 ENCOUNTER — Other Ambulatory Visit (HOSPITAL_COMMUNITY)
Admission: RE | Admit: 2021-10-09 | Discharge: 2021-10-09 | Disposition: A | Discharge status: Home / Self Care | Payer: Medicaid Other | Source: Ambulatory Visit | Attending: Family Medicine | Admitting: Family Medicine

## 2021-10-09 ENCOUNTER — Ambulatory Visit (INDEPENDENT_AMBULATORY_CARE_PROVIDER_SITE_OTHER): Payer: Medicaid Other | Admitting: Family Medicine

## 2021-10-09 ENCOUNTER — Encounter: Payer: Self-pay | Admitting: Family Medicine

## 2021-10-09 ENCOUNTER — Encounter: Payer: Self-pay | Admitting: General Practice

## 2021-10-09 VITALS — BP 111/70 | HR 73 | Wt 185.0 lb

## 2021-10-09 DIAGNOSIS — O09899 Supervision of other high risk pregnancies, unspecified trimester: Secondary | ICD-10-CM

## 2021-10-09 DIAGNOSIS — Z3480 Encounter for supervision of other normal pregnancy, unspecified trimester: Secondary | ICD-10-CM | POA: Diagnosis present

## 2021-10-09 DIAGNOSIS — Z862 Personal history of diseases of the blood and blood-forming organs and certain disorders involving the immune mechanism: Secondary | ICD-10-CM | POA: Diagnosis not present

## 2021-10-09 NOTE — Progress Notes (Signed)
DATING AND VIABILITY SONOGRAM   Megan Mckay is a 26 y.o. year old G16P0101 with LMP No LMP recorded. Patient is pregnant. which would correlate to  Unknown weeks gestation.  She has regular menstrual cycles.   She is here today for a confirmatory initial sonogram.    GESTATION: [place]ING[place]     FETAL ACTIVITY:          Heart rate         166 bpm          The fetus is active.    ADNEXA: The ovaries are normal.   GESTATIONAL AGE AND  BIOMETRICS:  Gestational criteria: Estimated Date of Delivery: 04-28-21 by LMP now at 07-25-21  Previous Scans:0      CROWN RUMP LENGTH          4.24 cm         11.1 weeks      4.38 cm 11.1 cm                                                                           AVERAGE EGA(BY THIS SCAN):  11-1 weeks  WORKING EDD( LMP ):  04-30-2021     TECHNICIAN COMMENTS:  Patient informed that the ultrasound is considered a limited obstetric ultrasound and is not intended to be a complete ultrasound exam.  Patient also informed that the ultrasound is not being completed with the intent of assessing for fetal or placental anomalies or any pelvic abnormalities. Explained that the purpose of today's ultrasound is to assess for fetal heart rate.  Patient acknowledges the purpose of the exam and the limitations of the study.     Ashleigh Luckow 10/09/2021 1:56 PM

## 2021-10-09 NOTE — Progress Notes (Signed)
Subjective:  Megan Mckay is a G2P0101 [redacted]w[redacted]d being seen today for her first obstetrical visit.  Her obstetrical history is significant for  uncomplicated delivery at 35 weeks due to PROM . This is her second pregnancy. Patient does intend to breast feed. Pregnancy history fully reviewed.  Patient reports no complaints.  BP 111/70   Pulse 73   Wt 185 lb (83.9 kg)   LMP 07/25/2021   BMI 30.79 kg/m   HISTORY: OB History  Gravida Para Term Preterm AB Living  2 1   1   1   SAB IAB Ectopic Multiple Live Births          1    # Outcome Date GA Lbr Len/2nd Weight Sex Delivery Anes PTL Lv  2 Current           1 Preterm 04/2017 [redacted]w[redacted]d   M Vag-Spont EPI N LIV    Past Medical History:  Diagnosis Date   History of Henoch-Schonlein purpura     History reviewed. No pertinent surgical history.  Family History  Problem Relation Age of Onset   Cancer Neg Hx    Hypertension Neg Hx      Exam  BP 111/70   Pulse 73   Wt 185 lb (83.9 kg)   LMP 07/25/2021   BMI 30.79 kg/m   Chaperone present during exam  CONSTITUTIONAL: Well-developed, well-nourished female in no acute distress.  HENT:  Normocephalic, atraumatic, External right and left ear normal. Oropharynx is clear and moist EYES: Conjunctivae and EOM are normal. Pupils are equal, round, and reactive to light. No scleral icterus.  NECK: Normal range of motion, supple, no masses.  Normal thyroid.  CARDIOVASCULAR: Normal heart rate noted, regular rhythm RESPIRATORY: Clear to auscultation bilaterally. Effort and breath sounds normal, no problems with respiration noted. BREASTS: Symmetric in size. No masses, skin changes, nipple drainage, or lymphadenopathy. ABDOMEN: Soft, normal bowel sounds, no distention noted.  No tenderness, rebound or guarding.  PELVIC: Normal appearing external genitalia; normal appearing vaginal mucosa and cervix. No abnormal discharge noted. Normal uterine size, no other palpable masses, no uterine or  adnexal tenderness. MUSCULOSKELETAL: Normal range of motion. No tenderness.  No cyanosis, clubbing, or edema.  2+ distal pulses. SKIN: Skin is warm and dry. No rash noted. Not diaphoretic. No erythema. No pallor. NEUROLOGIC: Alert and oriented to person, place, and time. Normal reflexes, muscle tone coordination. No cranial nerve deficit noted. PSYCHIATRIC: Normal mood and affect. Normal behavior. Normal judgment and thought content.    Assessment:    Pregnancy: G2P0101 Patient Active Problem List   Diagnosis Date Noted   Supervision of other normal pregnancy, antepartum 10/09/2021   Language barrier affecting health care 11/18/2016   History of Henoch-Schonlein purpura 11/18/2016   First pregnancy in adolescent 97 years of age or older, antepartum 11/18/2016   Nausea/vomiting in pregnancy 11/18/2016      Plan:   1. Supervision of other normal pregnancy, antepartum FHT and FH normal - CBC/D/Plt+RPR+Rh+ABO+RubIgG... - Hemoglobin A1c - 01/18/2017 MFM OB DETAIL +14 WK; Future - Culture, OB Urine - Cytology - PAP( [place]) - Panorama Prenatal Test Full Panel - [facility] - [facility] AMB BABYSCRIPTS OPT IN  2. History of preterm delivery, currently pregnant Will watch for preterm labor symptoms  3. History of Henoch-Schonlein purpura Has previous MFM consult - slight increased risk of HTN in pregnancy, but otherwise should have no other complications related to this.  Initial labs obtained Continue prenatal vitamins Reviewed n/v relief measures  and warning s/s to report Reviewed recommended weight gain based on pre-gravid BMI Encouraged well-balanced diet Genetic & carrier screening discussed: requests Panorama,  Ultrasound discussed; fetal survey: requested CCNC completed> form faxed if has or is planning to apply for medicaid The nature of [facility] [employer] with multiple MDs and other Advanced Practice Providers was explained to patient; also emphasized  that fellows, residents, and students are part of our team.   Problem list reviewed and updated. 75% of 30 min visit spent on counseling and coordination of care.     Levie Heritage 10/09/2021

## 2021-10-10 LAB — CBC/D/PLT+RPR+RH+ABO+RUBIGG...
Antibody Screen: NEGATIVE
Basophils Absolute: 0 10*3/uL (ref 0.0–0.2)
Basos: 0 %
EOS (ABSOLUTE): 0.1 10*3/uL (ref 0.0–0.4)
Eos: 1 %
HCV Ab: NONREACTIVE
HIV Screen 4th Generation wRfx: NONREACTIVE
Hematocrit: 40.6 % (ref 34.0–46.6)
Hemoglobin: 13.2 g/dL (ref 11.1–15.9)
Hepatitis B Surface Ag: NEGATIVE
Immature Grans (Abs): 0 10*3/uL (ref 0.0–0.1)
Immature Granulocytes: 0 %
Lymphocytes Absolute: 3.1 10*3/uL (ref 0.7–3.1)
Lymphs: 34 %
MCH: 28.6 pg (ref 26.6–33.0)
MCHC: 32.5 g/dL (ref 31.5–35.7)
MCV: 88 fL (ref 79–97)
Monocytes Absolute: 0.4 10*3/uL (ref 0.1–0.9)
Monocytes: 4 %
Neutrophils Absolute: 5.6 10*3/uL (ref 1.4–7.0)
Neutrophils: 61 %
Platelets: 343 10*3/uL (ref 150–450)
RBC: 4.61 x10E6/uL (ref 3.77–5.28)
RDW: 13.7 % (ref 11.7–15.4)
RPR Ser Ql: NONREACTIVE
Rh Factor: POSITIVE
Rubella Antibodies, IGG: 3.92 index (ref >0.99)
WBC: 9.2 10*3/uL (ref 3.4–10.8)

## 2021-10-10 LAB — HEMOGLOBIN A1C
Est. average glucose Bld gHb Est-mCnc: 117 mg/dL
Hgb A1c MFr Bld: 5.7 % — ABNORMAL HIGH (ref 4.8–5.6)

## 2021-10-10 LAB — HCV INTERPRETATION

## 2021-10-11 LAB — CULTURE, OB URINE

## 2021-10-11 LAB — URINE CULTURE, OB REFLEX

## 2021-10-14 LAB — CYTOLOGY - PAP
Chlamydia: NEGATIVE
Comment: NEGATIVE
Comment: NORMAL
Diagnosis: NEGATIVE
Neisseria Gonorrhea: NEGATIVE

## 2021-10-15 LAB — PANORAMA PRENATAL TEST FULL PANEL:PANORAMA TEST PLUS 5 ADDITIONAL MICRODELETIONS: FETAL FRACTION: 4.7

## 2021-10-17 LAB — HORIZON CUSTOM: REPORT SUMMARY: NEGATIVE

## 2021-10-22 ENCOUNTER — Telehealth: Payer: Self-pay

## 2021-10-22 NOTE — Telephone Encounter (Signed)
-----   Message from Levie Heritage, DO sent at 10/17/2021  5:09 PM EDT ----- Her A1c is elevated - we need her to come in and do an early GTT.

## 2021-10-22 NOTE — Telephone Encounter (Signed)
Called patient with [facility] [phone] to inform her of elevated Hemoglobin A1C. Left message for patient to call the office back. Tandra Rosado l Cleola Perryman, CMA

## 2021-11-07 ENCOUNTER — Ambulatory Visit (INDEPENDENT_AMBULATORY_CARE_PROVIDER_SITE_OTHER): Payer: Medicaid Other | Admitting: Family Medicine

## 2021-11-07 ENCOUNTER — Encounter: Payer: Self-pay | Admitting: General Practice

## 2021-11-07 VITALS — BP 107/55 | HR 72 | Wt 186.0 lb

## 2021-11-07 DIAGNOSIS — O09899 Supervision of other high risk pregnancies, unspecified trimester: Secondary | ICD-10-CM

## 2021-11-07 DIAGNOSIS — Z758 Other problems related to medical facilities and other health care: Secondary | ICD-10-CM

## 2021-11-07 DIAGNOSIS — Z3A15 15 weeks gestation of pregnancy: Secondary | ICD-10-CM

## 2021-11-07 DIAGNOSIS — Z3480 Encounter for supervision of other normal pregnancy, unspecified trimester: Secondary | ICD-10-CM

## 2021-11-07 DIAGNOSIS — O09892 Supervision of other high risk pregnancies, second trimester: Secondary | ICD-10-CM

## 2021-11-07 DIAGNOSIS — Z3482 Encounter for supervision of other normal pregnancy, second trimester: Secondary | ICD-10-CM

## 2021-11-07 DIAGNOSIS — Z862 Personal history of diseases of the blood and blood-forming organs and certain disorders involving the immune mechanism: Secondary | ICD-10-CM

## 2021-11-07 DIAGNOSIS — Z789 Other specified health status: Secondary | ICD-10-CM

## 2021-11-08 NOTE — Progress Notes (Signed)
   PRENATAL VISIT NOTE  Subjective:  Megan Mckay is a 26 y.o. G2P0101 at [redacted]w[redacted]d being seen today for ongoing prenatal care.  She is currently monitored for the following issues for this low-risk pregnancy and has Language barrier affecting health care; History of Henoch-Schonlein purpura; First pregnancy in adolescent 105 years of age or older, antepartum; Nausea/vomiting in pregnancy; and Supervision of other normal pregnancy, antepartum on their problem list.  Patient reports no complaints.  Contractions: Not present. Vag. Bleeding: None.  Movement: Absent. Denies leaking of fluid.   The following portions of the patient's history were reviewed and updated as appropriate: allergies, current medications, past family history, past medical history, past social history, past surgical history and problem list.   Objective:   Vitals:   11/07/21 1612  BP: (!) 107/55  Pulse: 72  Weight: 186 lb (84.4 kg)    Fetal Status: Fetal Heart Rate (bpm): 145   Movement: Absent     General:  Alert, oriented and cooperative. Patient is in no acute distress.  Skin: Skin is warm and dry. No rash noted.   Cardiovascular: Normal heart rate noted  Respiratory: Normal respiratory effort, no problems with respiration noted  Abdomen: Soft, gravid, appropriate for gestational age.  Pain/Pressure: Present     Pelvic: Cervical exam deferred        Extremities: Normal range of motion.  Edema: None  Mental Status: Normal mood and affect. Normal behavior. Normal judgment and thought content.   Assessment and Plan:  Pregnancy: G2P0101 at [redacted]w[redacted]d 1. Supervision of other normal pregnancy, antepartum FHT and FH normal Needs early GTT due to prediabetes   2. Language barrier affecting health care  3. History of Henoch-Schonlein purpura  4. History of preterm delivery, currently pregnant   Preterm labor symptoms and general obstetric precautions including but not limited to vaginal bleeding, contractions,  leaking of fluid and fetal movement were reviewed in detail with the patient. Please refer to After Visit Summary for other counseling recommendations.   No follow-ups on file.  Future Appointments  Date Time Provider [place]  11/22/2021  8:55 AM [facility] NURSE [facility] None  12/05/2021  1:15 PM [facility] NURSE [facility]  12/05/2021  1:30 PM [facility] US3 [facility]  12/12/2021  2:30 PM Truett Mainland, DO [facility] None  01/16/2022  3:30 PM Nehemiah Settle Tanna Savoy, DO [facility] None    Truett Mainland, DO

## 2021-11-22 ENCOUNTER — Other Ambulatory Visit (INDEPENDENT_AMBULATORY_CARE_PROVIDER_SITE_OTHER): Payer: Medicaid Other

## 2021-11-22 DIAGNOSIS — Z3A17 17 weeks gestation of pregnancy: Secondary | ICD-10-CM

## 2021-11-22 DIAGNOSIS — R7309 Other abnormal glucose: Secondary | ICD-10-CM

## 2021-11-22 NOTE — Progress Notes (Signed)
Patient presents for early 2 ht GTT.Patient was sent to the lab for glucose test. Megan Mckay l Motty Borin, CMA

## 2021-11-23 LAB — GLUCOSE TOLERANCE, 2 HOURS W/ 1HR
Glucose, 1 hour: 133 mg/dL (ref 70–179)
Glucose, 2 hour: 118 mg/dL (ref 70–152)
Glucose, Fasting: 71 mg/dL (ref 70–91)

## 2021-12-05 ENCOUNTER — Ambulatory Visit: Payer: Medicaid Other | Admitting: *Deleted

## 2021-12-05 ENCOUNTER — Ambulatory Visit: Payer: Medicaid Other | Attending: Family Medicine

## 2021-12-05 VITALS — BP 119/58 | HR 85

## 2021-12-05 DIAGNOSIS — Z3480 Encounter for supervision of other normal pregnancy, unspecified trimester: Secondary | ICD-10-CM

## 2021-12-05 DIAGNOSIS — Z3A19 19 weeks gestation of pregnancy: Secondary | ICD-10-CM | POA: Insufficient documentation

## 2021-12-05 DIAGNOSIS — Z363 Encounter for antenatal screening for malformations: Secondary | ICD-10-CM | POA: Insufficient documentation

## 2021-12-05 DIAGNOSIS — O09292 Supervision of pregnancy with other poor reproductive or obstetric history, second trimester: Secondary | ICD-10-CM | POA: Diagnosis not present

## 2021-12-05 DIAGNOSIS — O99212 Obesity complicating pregnancy, second trimester: Secondary | ICD-10-CM | POA: Insufficient documentation

## 2021-12-12 ENCOUNTER — Encounter: Payer: Self-pay | Admitting: General Practice

## 2021-12-12 ENCOUNTER — Ambulatory Visit (INDEPENDENT_AMBULATORY_CARE_PROVIDER_SITE_OTHER): Payer: Medicaid Other | Admitting: Family Medicine

## 2021-12-12 VITALS — BP 109/62 | HR 72

## 2021-12-12 DIAGNOSIS — Z3482 Encounter for supervision of other normal pregnancy, second trimester: Secondary | ICD-10-CM

## 2021-12-12 DIAGNOSIS — Z3480 Encounter for supervision of other normal pregnancy, unspecified trimester: Secondary | ICD-10-CM

## 2021-12-12 DIAGNOSIS — Z3A20 20 weeks gestation of pregnancy: Secondary | ICD-10-CM

## 2021-12-12 NOTE — Progress Notes (Signed)
   PRENATAL VISIT NOTE  Subjective:  Megan Mckay is a 26 y.o. G2P0101 at [redacted]w[redacted]d being seen today for ongoing prenatal care.  She is currently monitored for the following issues for this low-risk pregnancy and has Language barrier affecting health care; History of Henoch-Schonlein purpura; First pregnancy in adolescent 83 years of age or older, antepartum; Nausea/vomiting in pregnancy; and Supervision of other normal pregnancy, antepartum on their problem list.  Patient reports no complaints.  Contractions: Not present. Vag. Bleeding: None.  Movement: Present. Denies leaking of fluid.   The following portions of the patient's history were reviewed and updated as appropriate: allergies, current medications, past family history, past medical history, past social history, past surgical history and problem list.   Objective:   Vitals:   12/12/21 1417  BP: 109/62  Pulse: 72    Fetal Status: Fetal Heart Rate (bpm): 145   Movement: Present     General:  Alert, oriented and cooperative. Patient is in no acute distress.  Skin: Skin is warm and dry. No rash noted.   Cardiovascular: Normal heart rate noted  Respiratory: Normal respiratory effort, no problems with respiration noted  Abdomen: Soft, gravid, appropriate for gestational age.  Pain/Pressure: Absent     Pelvic: Cervical exam deferred        Extremities: Normal range of motion.  Edema: None  Mental Status: Normal mood and affect. Normal behavior. Normal judgment and thought content.   Assessment and Plan:  Pregnancy: G2P0101 at [redacted]w[redacted]d 1. Supervision of other normal pregnancy, antepartum FHT and FH normal [place] normal  2. [redacted] weeks gestation of pregnancy   Preterm labor symptoms and general obstetric precautions including but not limited to vaginal bleeding, contractions, leaking of fluid and fetal movement were reviewed in detail with the patient. Please refer to After Visit Summary for other counseling recommendations.   No  follow-ups on file.  Future Appointments  Date Time Provider [place]  01/16/2022  3:30 PM Truett Mainland, DO [facility] None    Truett Mainland, DO

## 2022-01-16 ENCOUNTER — Encounter: Payer: Self-pay | Admitting: General Practice

## 2022-01-16 ENCOUNTER — Ambulatory Visit (INDEPENDENT_AMBULATORY_CARE_PROVIDER_SITE_OTHER): Payer: Medicaid Other | Admitting: Family Medicine

## 2022-01-16 VITALS — BP 111/65 | HR 105 | Wt 195.0 lb

## 2022-01-16 DIAGNOSIS — Z789 Other specified health status: Secondary | ICD-10-CM

## 2022-01-16 DIAGNOSIS — Z3480 Encounter for supervision of other normal pregnancy, unspecified trimester: Secondary | ICD-10-CM

## 2022-01-16 DIAGNOSIS — Z3A25 25 weeks gestation of pregnancy: Secondary | ICD-10-CM

## 2022-01-16 NOTE — Progress Notes (Signed)
   PRENATAL VISIT NOTE  Subjective:  Megan Mckay is a 26 y.o. G2P0101 at [redacted]w[redacted]d being seen today for ongoing prenatal care.  She is currently monitored for the following issues for this low-risk pregnancy and has Language barrier affecting health care; History of Henoch-Schonlein purpura; First pregnancy in adolescent 65 years of age or older, antepartum; Nausea/vomiting in pregnancy; and Supervision of other normal pregnancy, antepartum on their problem list.  Patient reports  nonproductive cough. Doesn't know what to take for it. No fevers .  Contractions: Not present. Vag. Bleeding: None.  Movement: Present. Denies leaking of fluid.   The following portions of the patient's history were reviewed and updated as appropriate: allergies, current medications, past family history, past medical history, past social history, past surgical history and problem list.   Objective:   Vitals:   01/16/22 1512  BP: 111/65  Pulse: (!) 105  Weight: 195 lb (88.5 kg)    Fetal Status: Fetal Heart Rate (bpm): 150   Movement: Present     General:  Alert, oriented and cooperative. Patient is in no acute distress.  Skin: Skin is warm and dry. No rash noted.   Cardiovascular: Normal heart rate noted  Respiratory: Normal respiratory effort, no problems with respiration noted  Abdomen: Soft, gravid, appropriate for gestational age.  Pain/Pressure: Absent     Pelvic: Cervical exam deferred        Extremities: Normal range of motion.  Edema: Trace  Mental Status: Normal mood and affect. Normal behavior. Normal judgment and thought content.   Assessment and Plan:  Pregnancy: G2P0101 at [redacted]w[redacted]d 1. Supervision of other normal pregnancy, antepartum FHT and FH normal. Robitussin or mucinex for cough.  Preterm labor symptoms and general obstetric precautions including but not limited to vaginal bleeding, contractions, leaking of fluid and fetal movement were reviewed in detail with the patient. Please  refer to After Visit Summary for other counseling recommendations.   No follow-ups on file.  Future Appointments  Date Time Provider Department Center  02/07/2022  9:35 AM Constant, Gigi Gin, MD [facility] None    Levie Heritage, DO

## 2022-02-07 ENCOUNTER — Encounter: Payer: Self-pay | Admitting: Obstetrics and Gynecology

## 2022-02-07 ENCOUNTER — Ambulatory Visit (INDEPENDENT_AMBULATORY_CARE_PROVIDER_SITE_OTHER): Payer: Medicaid Other | Admitting: Obstetrics and Gynecology

## 2022-02-07 VITALS — BP 115/63 | HR 78 | Wt 196.0 lb

## 2022-02-07 DIAGNOSIS — Z3483 Encounter for supervision of other normal pregnancy, third trimester: Secondary | ICD-10-CM

## 2022-02-07 DIAGNOSIS — Z3480 Encounter for supervision of other normal pregnancy, unspecified trimester: Secondary | ICD-10-CM

## 2022-02-07 DIAGNOSIS — Z3A28 28 weeks gestation of pregnancy: Secondary | ICD-10-CM | POA: Diagnosis not present

## 2022-02-07 NOTE — Progress Notes (Signed)
   PRENATAL VISIT NOTE  Subjective:  Megan Mckay is a 26 y.o. G2P0101 at [redacted]w[redacted]d being seen today for ongoing prenatal care.  She is currently monitored for the following issues for this low-risk pregnancy and has Language barrier affecting health care; History of Henoch-Schonlein purpura; First pregnancy in adolescent 42 years of age or older, antepartum; Nausea/vomiting in pregnancy; and Supervision of other normal pregnancy, antepartum on their problem list.  Patient reports no complaints.  Contractions: Not present. Vag. Bleeding: None.  Movement: Present. Denies leaking of fluid.   The following portions of the patient's history were reviewed and updated as appropriate: allergies, current medications, past family history, past medical history, past social history, past surgical history and problem list.   Objective:   Vitals:   02/07/22 0827  BP: 115/63  Pulse: 78  Weight: 196 lb (88.9 kg)    Fetal Status: Fetal Heart Rate (bpm): 150 Fundal Height: 28 cm Movement: Present     General:  Alert, oriented and cooperative. Patient is in no acute distress.  Skin: Skin is warm and dry. No rash noted.   Cardiovascular: Normal heart rate noted  Respiratory: Normal respiratory effort, no problems with respiration noted  Abdomen: Soft, gravid, appropriate for gestational age.  Pain/Pressure: Present     Pelvic: Cervical exam deferred        Extremities: Normal range of motion.  Edema: None  Mental Status: Normal mood and affect. Normal behavior. Normal judgment and thought content.   Assessment and Plan:  Pregnancy: G2P0101 at [redacted]w[redacted]d 1. Supervision of other normal pregnancy, antepartum Patient is doing well without complaints Third trimester labs with glucola today List of pediatricians provided Patient does not want contraception - Glucose Tolerance, 2 Hours w/1 Hour - RPR - HIV antibody (with reflex) - CBC  2. [redacted] weeks gestation of pregnancy   Preterm labor symptoms  and general obstetric precautions including but not limited to vaginal bleeding, contractions, leaking of fluid and fetal movement were reviewed in detail with the patient. Please refer to After Visit Summary for other counseling recommendations.   Return in about 2 weeks (around 02/21/2022) for in person, ROB, Low risk.  Future Appointments  Date Time Provider Department Center  02/20/2022  2:30 PM Lennart Pall, MD [facility] None  03/06/2022  3:10 PM Levie Heritage, DO [facility] None  03/20/2022  2:30 PM Adrian Blackwater Rhona Raider, DO [facility] None    Catalina Antigua, MD

## 2022-02-07 NOTE — Patient Instructions (Signed)
AREA PEDIATRIC/FAMILY PRACTICE PHYSICIANS  Central/[place]outheast [place] ([address])  [facility] Chambliss, MD; Eniola, MD; Hale, MD; Hensel, MD; McDiarmid, MD; McIntyer, MD; Neal, MD; Walden, MD [address] [place], [place] [address] [phone] Mon-Fri 8:30-12:30, 1:30-5:00 Providers come to see babies at [facility] Accepting Medicaid [facility] providers who accept newborns: Koirala, MD; Morrow, MD; Wolters, MD [address], [place], [place] [address] [phone] Mon-Fri 8:00-5:30 Babies seen by providers at [facility] Does NOT accept Medicaid Please call early in hospitalization for appointment (limited availability)  [facility] Mulberry, MD [address] [place], [place] [address] [phone] Mon, Tue, Thur, Fri 8:30-5:00, Wed 10:00-7:00 (closed 1-2pm) Babies seen by [facility] providers Accepting Medicaid Rubin - Pediatrician Rubin, MD [address], [place], [place] [address] [phone] Mon-Fri 8:30-5:00, Sat 8:30-12:00 Provider comes to see babies at [facility] Accepting Medicaid Must have been referred from current patients or contacted office prior to delivery [facility] ([facility]) Brown, MD; Chandler, MD; Ettefagh, MD; Grant, MD; Lester, MD; McCormick, MD; McQueen, MD; Prose, MD; Simha, MD; Stanley, MD; Stryffeler, NP; Tebben, NP [address], [place], [place] [address] [phone] Mon, Tue, Thur, Fri 8:30-5:30, Wed 9:30-5:30, Sat 8:30-12:30 Babies seen by [facility] providers Accepting Medicaid Only accepting infants of first-time parents or siblings of current patients Hospital discharge coordinator will make follow-up appointment Jack Amos [address], [place], [place]  [address] [phone]   Fax - [phone] [facility] [address], [place], [place]  [address] Phone - [phone]   Fax - [phone] Shilpa Gosrani [address], [place], [place]  [address] [phone]  East/[place]ortheast [place] ([address]) [facility] Bates, MD; [place]assfield, MD; Cooper, Cox, MD; MD; Davis, MD; Dovico, MD; Ettefaugh, MD; Little, MD; Lowe, MD; Keiffer, MD; Melvin, MD; Sumner, MD; Williams, MD [address], [place], [place] [address] [phone] Mon-Fri 8:30-5:00 (extended evenings Mon-Thur as needed), Sat-Sun 10:00-1:00 Providers come to see babies at [facility] Accepting Medicaid for families of first-time babies and families with all children in the household age 3 and under. Must register with office prior to making appointment (M-F only). [facility] Henson, NP; Knapp, MD; Lalonde, MD; Tysinger, PA [address] [place], [place] [address] [phone] Mon-Fri 8:00-5:00 Babies seen by providers at [facility] Does NOT accept Medicaid/Commercial Insurance Only [facility] ([facility])  Artis, MD; Barnes, MD; Bratton, MD; Coccaro, MD; [place] Gardner, MD; Kramer, MD; Marshall, MD; Netherton, MD; Poleto, MD; Skinner, MD [address] [place], [place], [place] [address] [phone] Mon-Fri 8:30-5:30, Sat (Oct.-Mar.) 9:00-1:00 Babies seen by providers at [facility] Accepting Medicaid  [place] ([address]) [facility] Reid, MD; Warner, MD [address], [place], [place] [address] [phone] Mon-Fri 8:30-5:00, Sat 8:30-12:00 Providers come to see babies at [facility] Does NOT accept Medicaid [facility] Becker, PA; Hagler, MD; Scifres, PA; Sun, MD; Swayne, MD [address], , [place] [address] [phone] Mon-Fri 8:00-5:00 Babies seen by providers at [facility] Does NOT accept Medicaid Only accepting babies of parents who  are patients Please call early in hospitalization for appointment (limited availability)  Pediatricians Clark, MD; Frye, MD; Kelleher, MD; Mack, NP; Miller, MD; O'Keller, MD; Patterson, NP; Pudlo, MD; Puzio, MD; Thomas, MD; Tucker, MD; Twiselton, MD [address], [place], [place] [address] [phone] Mon-Fri 8:00-5:00, Sat 9:00-12:00 Providers come to see babies at [facility] Does NOT accept Medicaid  [place] ([address]) [facility] providers accepting new patients: Brake, NP; Wharton, PA [address], [place], [place] [address] [phone] Mon-Fri 8:00-5:00 Babies seen by providers at [facility] Does NOT accept Medicaid Only accepting babies of parents who are patients Please call early in hospitalization for appointment (limited availability) [facility] Gay, MD; Quinlan, MD [address] [place], [place] [address] [phone] (press [phone] to schedule appointment) Mon-Fri 8:00-5:00 Providers come to see babies at [facility] Does NOT accept Medicaid [facility] Mazer, MD [address] [place], [place] [address] [phone] Mon-Fri 8:30-5:00 (lunch 12:30-1:00), extended hours by appointment only Wed 5:00-6:30 Babies seen by [facility] providers Accepting Medicaid [facility] Banks, MD; Jordan, MD; Koberlein, MD [address], [place], [place] [address] [phone] Mon-Fri 8:00-5:00 Babies seen by [facility] providers Does NOT accept Medicaid [facility] Parker, MD; Hunter, MD; Wallace, DO [address] [place], [place] [address] [phone] Mon-Fri 8:00-5:00 Babies seen by [facility] providers Does NOT accept Medicaid [facility] Brandon, PA; Brecken, PA; Christy, NP; Dees, MD; DeClaire, MD; DeWeese, MD; [place], NP; Mills, NP; Parrish, NP; Smoot, NP; Summer, MD; Vapne,  MD [address] [place], [place] [address] [phone] Mon-Fri 8:30-5:00, Sat 10:00-1:00 Providers come to see babies at [facility] Does NOT accept Medicaid Free prenatal information session Tuesdays at 4:45pm [facility] Bouska, MD; Gordon, PA; Jeffery, PA; Weber, PA [address] [place] [address] [phone] Mon-Fri 7:30-5:30 Babies seen by [facility] providers [facility] [address], [place], [place]  [address] [phone]   Fax - [phone]  [place] ([address] & [address]) [facility] [place] [address] [place], [place] [address] [phone] Mon-Thur 8:00-6:00 Providers come to see babies at [facility] Accepting Medicaid [facility] Anderson, NP; Badger, MD; Beal, PA; [place], PA [address] [place], [place] [address] [phone] Mon-Thur 7:30-7:30, Fri 7:30-4:30 Babies seen by [facility] providers Accepting Medicaid [facility] Agbuya, MD; Klett, NP; Romgoolam, MD [address], [place], [place] [address] [phone] Mon-Fri 8:30-5:00, Sat 8:30-12:00 Providers come to see babies at [facility] Accepting Medicaid Must have "Meet & Greet" appointment at office prior to delivery [facility] ([facility] [employer] [facility] McCord, MD; Wallace, MD; Wood, MD [address], [place], [place] [address] [phone] Mon-Wed 8:00-6:00, Thur-Fri 8:00-5:00, Sat 9:00-12:00 Providers come to see babies at [facility] Does NOT accept Medicaid Only accepting siblings of current patients [facility]  [address], [place], [place]  [address] [phone]   Fax - [phone] [facility] [address], [place], [place]  [address] [phone]   Fax -  [phone]  [place] ([address] & [address]) [facility] Cirigliano, DO; Matthews, DO [address] [place], [place] [address] [phone] Mon-Fri 7:00-5:00 Babies seen by [facility] providers Does NOT accept Medicaid [facility] Briscoe, MD; Howley, PA; Moreira, PA [address], [place],  [address] [phone] Mon-Fri 8:00-5:00 Babies seen by [facility] providers Accepting Medicaid [facility] Boyd, MD; Church, PA; Jones, NP; Osborn, PA [address], [place],  [address] [phone] Mon-Fri 8:00-5:00 Babies seen by providers at [facility] Accepting Medicaid  [place] ([address]) [facility] [place] Wendling, DO [address] [place], [place] [address] [phone] Mon-Fri 8:00-5:00 Babies seen by [facility] providers Does NOT accept Medicaid Limited availability, please call early in hospitalization to schedule follow-up [facility] Calderon, PA; Cummings, MD; Dillard, MD; Martin, PA; Olson, MD; VanDeven, PA [address], [place], [place] [address] [phone] Mon-Fri 8:30-5:00, Sat 9:00-12:00 Babies seen by providers at [facility] Accepting Medicaid Please register online then schedule online or call office [email or web address] Hunter, NP; Kumar, MD; Martin Rogers, PA [address], [place], [place] [address] [phone] Mon-Fri 8:00-5:00 Babies seen by providers at [facility] Accepting Medicaid [facility] ([facility] Calmar, MD; Kristi Fleenor, NP; West, MD [address], [place], [place] [address] [phone] Mon-Fri 8:00-5:30, Sat&Sun by appointment (phones open at  8:30) Babies seen by [facility] providers Accepting Medicaid Must be a first-time baby or sibling of current patient [facility] [place]  [address], [place], [place]  [address] [phone]   Fax - [phone]  [place] ([address]) [facility] Brown, PA; Cowen, PA; Rice, MD; Helton, PA; Spry, MD [address] [place], [place] [address] [phone] Mon-Thur 8:00-7:00, Fri 8:00-5:00, Sat 8:00-12:00, Sun 9:00-12:00 Babies seen by [facility] providers Accepting Medicaid [facility] Coe-Goins, MD; Marshall, MD; Pierre-Louis, MD [address] [place], [place], [place] [address] [phone] Mon-Thur 8:00-5:00 Babies seen by providers at [facility] Accepting Medicaid [facility] Bratton, MD; Coe-Goins, MD; Hayes, MD; Lewis, MD; List, MD; Lott, MD; Marshall, MD; Moran, MD; O'Neal, MD; Pierre-Louis, MD; Pitonzo, MD; Scholer, MD; Spangle, MD [address] [place], [place] [address] [phone] Mon-Fri 8:00-5:30, Sat (Oct.-Mar.) 9:00-1:00 Babies seen by providers at [facility] Accepting Medicaid Must fill out new patient packet, available online at [email or web address] [facility] ([facility]) Friddle, NP; Harris, NP; Kelly, NP; Logan, MD; Melvin, PA; Poth, MD; Ramadoss, MD; Stanton, NP [address], [place], [place] [address] [phone] Mon-Thur 8:00-5:30, Fri 8:00-5:00 Babies seen by providers at [facility] Accepting Medicaid  [place] ([address]) [facility] Dixon, PA; La[place], MD; Pickard, MD; Tapia, PA [address], [place], [place] [address] [phone] Mon-Fri 8:00-5:00 Babies seen by providers at [facility] Accepting Medicaid   [place] ([address]) [facility] [place] Masneri, DO; Meyers, MD; Nelson, PA [address], [place], [place] [address] [phone] Mon-Fri 8:00-5:00 Babies seen by providers at [facility] Does NOT accept Medicaid Limited appointment availability, please call early in hospitalization  [facility] [place] Kunedd, DO; McGowen, MD [address], [place], [place] [address] [phone] Mon-Fri 8:00-5:00 Babies seen by [facility] providers Does NOT accept Medicaid [facility] [place] Cameron, MD; MacDonald, MD; Michaels, PA; Nayak, MD [address] [place] [address], [place], [place] [address] [phone] Mon-Fri 8:00-5:00 After hours clinic ([address] [place],  [address]) [phone] Mon-Fri 5:00-8:00, Sat 12:00-6:00, Sun 10:00-4:00 Babies seen by [facility] providers Accepting Medicaid [facility] [place] [address], [place], [place]  [address] [phone]   Fax - [phone]  [place] ([address]) [facility] [place] [facility] Andy, MD [address], [place], [place] [address] [phone] Mon-Fri 8:00-5:00 Babies seen by [facility] providers Does NOT accept Medicaid [facility] [place] ([facility] [place]) Eksir, MD [address], [place], [place] [address] [phone] Mon-Thur 8:00-7:00, Fri 8:00-5:00, Sat 8:00-12:00 Babies seen by providers at [facility] Accepting Medicaid - but does not have vaccinations in office (must be received elsewhere) Limited availability, please call early in hospitalization  [place] ([address]) [facility]  Charlene Flemming, MD [address],  [place] [address] [phone]  Fax [phone]  [facility]  Kimberly Newton, MD, Annamarie Streilein, PA, Carla Hampton, PA [address] [place], [place]  [address] [phone] [facility]  [address], [place], [place] [address] [phone] [address], [place], [place] [address] [phone] (West Office)  [facility] [address], [place], [place] [address] [phone] [facility] [address], [place], [place] [address] [phone] [facility] [address], [place], [place] [address] [phone] [facility] [address], [place], [place] [address] [phone] [facility] [address], [place], [place] [address] [phone] [facility] [address], [place], [place] [address] [phone] [facility]  [address], [place], [place] [address] [phone] Dr[facility] [address] [place] [address], [place],  [address] [phone] [facility] [address], [address], [place],  [address] [phone] [facility] [address], [place],  [address] [phone]  

## 2022-02-08 LAB — CBC
Hematocrit: 32.1 % — ABNORMAL LOW (ref 34.0–46.6)
Hemoglobin: 10.4 g/dL — ABNORMAL LOW (ref 11.1–15.9)
MCH: 29 pg (ref 26.6–33.0)
MCHC: 32.4 g/dL (ref 31.5–35.7)
MCV: 89 fL (ref 79–97)
Platelets: 302 10*3/uL (ref 150–450)
RBC: 3.59 x10E6/uL — ABNORMAL LOW (ref 3.77–5.28)
RDW: 12.5 % (ref 11.7–15.4)
WBC: 8.7 10*3/uL (ref 3.4–10.8)

## 2022-02-08 LAB — RPR: RPR Ser Ql: NONREACTIVE

## 2022-02-08 LAB — GLUCOSE TOLERANCE, 2 HOURS W/ 1HR
Glucose, 1 hour: 143 mg/dL (ref 70–179)
Glucose, 2 hour: 90 mg/dL (ref 70–152)
Glucose, Fasting: 72 mg/dL (ref 70–91)

## 2022-02-08 LAB — HIV ANTIBODY (ROUTINE TESTING W REFLEX): HIV Screen 4th Generation wRfx: NONREACTIVE

## 2022-02-12 ENCOUNTER — Encounter: Payer: Self-pay | Admitting: General Practice

## 2022-02-17 ENCOUNTER — Other Ambulatory Visit: Payer: Self-pay

## 2022-02-17 ENCOUNTER — Inpatient Hospital Stay (HOSPITAL_COMMUNITY)
Admission: AD | Admit: 2022-02-17 | Discharge: 2022-02-17 | Disposition: A | Discharge status: Home / Self Care | Payer: Medicaid Other | Attending: Obstetrics and Gynecology | Admitting: Obstetrics and Gynecology

## 2022-02-17 DIAGNOSIS — O26893 Other specified pregnancy related conditions, third trimester: Secondary | ICD-10-CM | POA: Insufficient documentation

## 2022-02-17 DIAGNOSIS — Z1152 Encounter for screening for COVID-19: Secondary | ICD-10-CM | POA: Insufficient documentation

## 2022-02-17 DIAGNOSIS — R059 Cough, unspecified: Secondary | ICD-10-CM | POA: Insufficient documentation

## 2022-02-17 DIAGNOSIS — Z3A29 29 weeks gestation of pregnancy: Secondary | ICD-10-CM | POA: Insufficient documentation

## 2022-02-17 DIAGNOSIS — Z3493 Encounter for supervision of normal pregnancy, unspecified, third trimester: Secondary | ICD-10-CM

## 2022-02-17 DIAGNOSIS — Z3480 Encounter for supervision of other normal pregnancy, unspecified trimester: Secondary | ICD-10-CM

## 2022-02-17 LAB — RESP PANEL BY RT-PCR (RSV, FLU A&B, COVID)  RVPGX2
Influenza A by PCR: NEGATIVE
Influenza B by PCR: NEGATIVE
Resp Syncytial Virus by PCR: NEGATIVE
SARS Coronavirus 2 by RT PCR: NEGATIVE

## 2022-02-17 MED ORDER — CETIRIZINE HCL 10 MG PO TABS: 10.0000 mg | ORAL_TABLET | Freq: Every day | ORAL | 0 refills | Status: DC

## 2022-02-17 MED ORDER — BENZONATATE 100 MG PO CAPS: 100.0000 mg | ORAL_CAPSULE | Freq: Three times a day (TID) | ORAL | 0 refills | Status: AC

## 2022-02-17 NOTE — L&D Delivery Note (Signed)
Delivery Note Megan Mckay is a 27 y.o. G2P0101 at [redacted]w[redacted]d admitted for labor.   GBS Status:  Positive/-- (02/19 1136)  Labor course: Initial SVE: 5.5. Augmentation with: n/a. She then progressed to complete.  ROM: 0h 9m with light mec fluid  Birth: Delivery of a Live born female  Birth Weight:   APGAR: 45, 9  Newborn Delivery   Birth date/time: [birth year]20[birth year] 03:08:31 Delivery type: Vaginal, Spontaneous         Delivered via spontaneous vaginal delivery (Presentation: LOA ). Nuchal cord present: No.  Shoulders and body delivered in usual fashion. Infant placed directly on mom's abdomen for bonding/skin-to-skin, baby dried and stimulated. Cord clamped x 2 after 1 minute and cut by FOB.  Cord blood collected. Placenta delivered-Spontaneous  with 3 vessels . 20u Pitocin in 500cc LR given as a bolus after delivery of placenta.  Fundus firm with massage. Placenta inspected and appears to be intact with a 3 VC.  Sponge and instrument count were correct x2.  Intrapartum complications:  None Anesthesia:  epidural Lacerations:  1st degree Suture Repair: hemostatic, not needing repair EBL (mL):38.00     Mom to postpartum.  Baby to Couplet care / Skin to Skin. Placenta to L&D   Plans to Breast and bottlefeed Contraception: unsure Circumcision: wants inpatient  Note sent to [facility]: HP for pp visit.  Delivery Report:   Review the Delivery Report for details.     Signed: Christin Fudge, DNP,CNM 05/02/2022, 3:24 AM

## 2022-02-17 NOTE — MAU Provider Note (Signed)
History     CSN: [id number]  Arrival date and time: 02/17/22 1334   Event Date/Time   First Provider Initiated Contact with Patient 02/17/22 1415      Chief Complaint  Patient presents with   Cough   HPI Damiana Berrian is a 27 y.o. G2P0101 at 103w4d who presents to MAU with chief complaint of mild cough. This is a recurrent problem, onset one month ago. Patient has attempted management with Mucinex but states her symptoms persist. Mucinex last taken this morning. She denies SOB, chest pain, fever, activity intolerance, weakness or syncope. She denies pregnancy-related concerns including abdominal pain, vaginal bleeding, DFM, and leaking of fluid.   Patient receives care with [facility].  OB History     Gravida  2   Para  1   Term      Preterm  1   AB      Living  1      SAB      IAB      Ectopic      Multiple      Live Births  1           Past Medical History:  Diagnosis Date   History of Henoch-Schonlein purpura     No past surgical history on file.  Family History  Problem Relation Age of Onset   Asthma Sister    Cancer Neg Hx    Hypertension Neg Hx    Birth defects Neg Hx    Heart disease Neg Hx    Diabetes Neg Hx    Stroke Neg Hx     Social History   Tobacco Use   Smoking status: Never   Smokeless tobacco: Never  Vaping Use   Vaping Use: Never used  Substance Use Topics   Alcohol use: No   Drug use: No    Allergies: No Known Allergies  Medications Prior to Admission  Medication Sig Dispense Refill Last Dose   Prenatal Vit-Fe Fumarate-FA (MULTIVITAMIN-PRENATAL) 27-0.8 MG TABS tablet Take 1 tablet by mouth daily at 12 noon.       Review of Systems  Respiratory:  Positive for cough.   All other systems reviewed and are negative.  Physical Exam   Blood pressure 115/74, pulse 91, temperature 98.3 F (36.8 C), temperature source Oral, resp. rate 16, height 5\' 4"  (1.626 m), weight 90.4 kg, last menstrual period 07/25/2021,  SpO2 99 %, unknown if currently breastfeeding.  Physical Exam Vitals and nursing note reviewed. Exam conducted with a chaperone present.  Constitutional:      General: She is not in acute distress.    Appearance: Normal appearance. She is not ill-appearing.  Cardiovascular:     Rate and Rhythm: Normal rate and regular rhythm.     Pulses: Normal pulses.     Heart sounds: Normal heart sounds.  Pulmonary:     Effort: Pulmonary effort is normal.     Breath sounds: Normal breath sounds.  Abdominal:     Comments: Gravid  Skin:    Capillary Refill: Capillary refill takes less than 2 seconds.  Neurological:     Mental Status: She is alert and oriented to person, place, and time.  Psychiatric:        Mood and Affect: Mood normal.        Behavior: Behavior normal.        Thought Content: Thought content normal.        Judgment: Judgment normal.     MAU Course  Procedures  MDM --Pertinent negatives: SOB, chest pain, abnormal lung sounds, fever --Discussed chronic cough vs acute onset cough, workup and management plan   Patient Vitals for the past 24 hrs:  BP Temp Temp src Pulse Resp SpO2 Height Weight  02/17/22 1415 115/74 98.3 F (36.8 C) Oral 91 16 99 % -- --  02/17/22 1410 -- -- -- -- -- -- 5\' 4"  (1.626 m) 90.4 kg   Results for orders placed or performed during the hospital encounter of 02/17/22 (from the past 24 hour(s))  Resp panel by RT-PCR (RSV, Flu A&B, Covid) Anterior Nasal Swab     Status: None   Collection Time: 02/17/22  2:16 PM   Specimen: Anterior Nasal Swab  Result Value Ref Range   SARS Coronavirus 2 by RT PCR NEGATIVE NEGATIVE   Influenza A by PCR NEGATIVE NEGATIVE   Influenza B by PCR NEGATIVE NEGATIVE   Resp Syncytial Virus by PCR NEGATIVE NEGATIVE   Meds ordered this encounter  Medications   benzonatate (TESSALON) 100 MG capsule    Sig: Take 1 capsule (100 mg total) by mouth every 8 (eight) hours.    Dispense:  21 capsule    Refill:  0    Order  Specific Question:   Supervising Provider    Answer:   Inez Catalina [[id number]]   cetirizine (ZYRTEC ALLERGY) 10 MG tablet    Sig: Take 1 tablet (10 mg total) by mouth daily.    Dispense:  30 tablet    Refill:  0    Order Specific Question:   Supervising Provider    Answer:   Inez Catalina [[id number]]   Assessment and Plan  --27 y.o. G2P0101 at [redacted]w[redacted]d  --No pregnancy-related complaints --FHT 138 by doppler --Cough x 4 weeks --Advised Tessalon pearls, daily zyrtec, Mucinex as needed --Discharge home in stable condition with return precautions  Darlina Rumpf, [place], MSN, CNM 02/17/2022, 5:26 PM

## 2022-02-17 NOTE — MAU Note (Signed)
Megan Mckay is a 27 y.o. at [redacted]w[redacted]d here in MAU reporting: cough ongoing for a month. Came in because she is tired. No fever. Has tried mucinex with no relief. +FM. No bleeding or LOF.   Onset of complaint: ongoing  Pain score: 0/10  Vitals:   02/17/22 1415  BP: 115/74  Pulse: 91  Resp: 16  Temp: 98.3 F (36.8 C)  SpO2: 99%     FHT:138  Lab orders placed from triage: covid swab

## 2022-02-20 ENCOUNTER — Ambulatory Visit (INDEPENDENT_AMBULATORY_CARE_PROVIDER_SITE_OTHER): Payer: Medicaid Other | Admitting: Obstetrics and Gynecology

## 2022-02-20 ENCOUNTER — Encounter: Payer: Self-pay | Admitting: General Practice

## 2022-02-20 VITALS — BP 118/69 | HR 92 | Wt 200.0 lb

## 2022-02-20 DIAGNOSIS — Z3480 Encounter for supervision of other normal pregnancy, unspecified trimester: Secondary | ICD-10-CM

## 2022-02-20 DIAGNOSIS — Z789 Other specified health status: Secondary | ICD-10-CM

## 2022-02-20 DIAGNOSIS — Z3A30 30 weeks gestation of pregnancy: Secondary | ICD-10-CM

## 2022-02-20 DIAGNOSIS — Z3483 Encounter for supervision of other normal pregnancy, third trimester: Secondary | ICD-10-CM

## 2022-02-20 DIAGNOSIS — R053 Chronic cough: Secondary | ICD-10-CM

## 2022-02-20 MED ORDER — FAMOTIDINE 20 MG PO TABS: 20.0000 mg | ORAL_TABLET | Freq: Two times a day (BID) | ORAL | 3 refills | Status: AC

## 2022-02-20 NOTE — Progress Notes (Signed)
   PRENATAL VISIT NOTE  Subjective:  Megan Mckay is a 27 y.o. G2P0101 at [redacted]w[redacted]d being seen today for ongoing prenatal care.  She is currently monitored for the following issues for this low-risk pregnancy and has Language barrier affecting health care; History of Henoch-Schonlein purpura; Supervision of other normal pregnancy, antepartum; and Chronic cough on their problem list.  Patient reports  persistent cough x 6 weeks . Has tried mucinex & tessalon perles. Only cough drops seem to help. Worse at night. No relation to activity or meals. +heart burn but usually alleviated w/ Tums. No fevers, CP, or shortness of breath. Seen in MAU on 1/1 with these complaints - vital signs unremarkable (SpO2 99% on RA). No CXR performed. Contractions: Not present. Vag. Bleeding: None.  Movement: Present. Denies leaking of fluid.   The following portions of the patient's history were reviewed and updated as appropriate: allergies, current medications, past family history, past medical history, past social history, past surgical history and problem list.   Objective:   Vitals:   02/20/22 1450  BP: 118/69  Pulse: 92  Weight: 200 lb (90.7 kg)   Fetal Status: Fetal Heart Rate (bpm): 140 Fundal Height: 29 cm Movement: Present     General:  Alert, oriented and cooperative. Patient is in no acute distress.  Skin: Skin is warm and dry. No rash noted.   Cardiovascular: Normal heart rate noted  Respiratory: Normal respiratory effort, no problems with respiration noted. Lungs CTAB, no wheezes.  Abdomen: Soft, gravid, appropriate for gestational age.  Pain/Pressure: Absent      Assessment and Plan:  Pregnancy: G2P0101 at [redacted]w[redacted]d 1. Supervision of other normal pregnancy, antepartum 2. [redacted] weeks gestation of pregnancy Discussed tdap & flu shot - declines flu, considering tdap  3. Chronic cough Exam unremarkable Will trial pepcid BID - if persistent cough would consider CXR  Please refer to After Visit  Summary for other counseling recommendations.   Return in about 2 weeks (around 03/06/2022) for low risk return OB, in person ok for MD/APP.  Future Appointments  Date Time Provider [place]  03/06/2022  3:10 PM Truett Mainland, DO [facility] None  03/20/2022  2:30 PM Truett Mainland, DO [facility] None  04/02/2022 10:55 AM Truett Mainland, DO [facility] None  04/10/2022  2:30 PM Truett Mainland, DO [facility] None  04/17/2022  2:30 PM Truett Mainland, DO [facility] None  04/24/2022  2:30 PM Truett Mainland, DO [facility] None  05/01/2022  2:50 PM Nehemiah Settle Tanna Savoy, DO [facility] None   Inez Catalina, MD

## 2022-03-06 ENCOUNTER — Ambulatory Visit (INDEPENDENT_AMBULATORY_CARE_PROVIDER_SITE_OTHER): Payer: Medicaid Other | Admitting: Family Medicine

## 2022-03-06 VITALS — BP 122/74 | HR 88 | Wt 203.0 lb

## 2022-03-06 DIAGNOSIS — Z23 Encounter for immunization: Secondary | ICD-10-CM | POA: Diagnosis not present

## 2022-03-06 DIAGNOSIS — R053 Chronic cough: Secondary | ICD-10-CM

## 2022-03-06 DIAGNOSIS — Z3480 Encounter for supervision of other normal pregnancy, unspecified trimester: Secondary | ICD-10-CM

## 2022-03-06 DIAGNOSIS — Z3A32 32 weeks gestation of pregnancy: Secondary | ICD-10-CM

## 2022-03-06 DIAGNOSIS — Z3483 Encounter for supervision of other normal pregnancy, third trimester: Secondary | ICD-10-CM

## 2022-03-06 DIAGNOSIS — Z789 Other specified health status: Secondary | ICD-10-CM

## 2022-03-06 NOTE — Progress Notes (Signed)
   PRENATAL VISIT NOTE  Subjective:  Megan Mckay is a 27 y.o. G2P0101 at [redacted]w[redacted]d being seen today for ongoing prenatal care.  She is currently monitored for the following issues for this low-risk pregnancy and has Language barrier affecting health care; History of Henoch-Schonlein purpura; Supervision of other normal pregnancy, antepartum; and Chronic cough on their problem list.  Patient reports no complaints.  Contractions: Not present. Vag. Bleeding: None.  Movement: Present. Denies leaking of fluid.   The following portions of the patient's history were reviewed and updated as appropriate: allergies, current medications, past family history, past medical history, past social history, past surgical history and problem list.   Objective:   Vitals:   03/06/22 1532  BP: 122/74  Pulse: 88  Weight: 203 lb (92.1 kg)    Fetal Status: Fetal Heart Rate (bpm): 123   Movement: Present     General:  Alert, oriented and cooperative. Patient is in no acute distress.  Skin: Skin is warm and dry. No rash noted.   Cardiovascular: Normal heart rate noted  Respiratory: Normal respiratory effort, no problems with respiration noted  Abdomen: Soft, gravid, appropriate for gestational age.  Pain/Pressure: Absent     Pelvic: Cervical exam deferred        Extremities: Normal range of motion.  Edema: None  Mental Status: Normal mood and affect. Normal behavior. Normal judgment and thought content.   Assessment and Plan:  Pregnancy: G2P0101 at [redacted]w[redacted]d 1. Supervision of other normal pregnancy, antepartum FHT and Fh normal Tdap today Recommended RSV vaccine.  2. Language barrier affecting health care English speaking  3. Chronic cough Improved.  Preterm labor symptoms and general obstetric precautions including but not limited to vaginal bleeding, contractions, leaking of fluid and fetal movement were reviewed in detail with the patient. Please refer to After Visit Summary for other  counseling recommendations.   No follow-ups on file.  Future Appointments  Date Time Provider [place]  03/20/2022  2:30 PM Truett Mainland, DO [facility] None  04/02/2022 10:55 AM Truett Mainland, DO [facility] None  04/10/2022  2:30 PM Truett Mainland, DO [facility] None  04/17/2022  2:30 PM Truett Mainland, DO [facility] None  04/24/2022  2:30 PM Truett Mainland, DO [facility] None  05/01/2022  2:50 PM Truett Mainland, DO [facility] None    Truett Mainland, DO

## 2022-03-20 ENCOUNTER — Ambulatory Visit (INDEPENDENT_AMBULATORY_CARE_PROVIDER_SITE_OTHER): Payer: Medicaid Other | Admitting: Family Medicine

## 2022-03-20 VITALS — BP 118/73 | HR 88 | Wt 202.0 lb

## 2022-03-20 DIAGNOSIS — Z3480 Encounter for supervision of other normal pregnancy, unspecified trimester: Secondary | ICD-10-CM

## 2022-03-20 DIAGNOSIS — Z789 Other specified health status: Secondary | ICD-10-CM

## 2022-03-20 DIAGNOSIS — Z3483 Encounter for supervision of other normal pregnancy, third trimester: Secondary | ICD-10-CM

## 2022-03-20 DIAGNOSIS — Z3A34 34 weeks gestation of pregnancy: Secondary | ICD-10-CM

## 2022-03-20 NOTE — Progress Notes (Signed)
   PRENATAL VISIT NOTE  Subjective:  Megan Mckay is a 27 y.o. G2P0101 at [redacted]w[redacted]d being seen today for ongoing prenatal care.  She is currently monitored for the following issues for this low-risk pregnancy and has Language barrier affecting health care; History of Henoch-Schonlein purpura; Supervision of other normal pregnancy, antepartum; and Chronic cough on their problem list.  Patient reports no complaints.  Contractions: Not present. Vag. Bleeding: None.  Movement: Present. Denies leaking of fluid.   The following portions of the patient's history were reviewed and updated as appropriate: allergies, current medications, past family history, past medical history, past social history, past surgical history and problem list.   Objective:   Vitals:   03/20/22 1428  BP: 118/73  Pulse: 88  Weight: 202 lb (91.6 kg)    Fetal Status: Fetal Heart Rate (bpm): 121   Movement: Present     General:  Alert, oriented and cooperative. Patient is in no acute distress.  Skin: Skin is warm and dry. No rash noted.   Cardiovascular: Normal heart rate noted  Respiratory: Normal respiratory effort, no problems with respiration noted  Abdomen: Soft, gravid, appropriate for gestational age.  Pain/Pressure: Absent     Pelvic: Cervical exam deferred        Extremities: Normal range of motion.  Edema: Trace  Mental Status: Normal mood and affect. Normal behavior. Normal judgment and thought content.   Assessment and Plan:  Pregnancy: G2P0101 at [redacted]w[redacted]d  1. Supervision of other normal pregnancy, antepartum FHT and FH normal. Patient concerned about recovery from delivery. Told by her employer that she doesn't get FMLA because she doesn't get insurance through them. I told her that she should still be able to get it. She doesn't want to be out for 12 weeks, though - just 8 weeks. She was told that if she is going to be out for more than 6 weeks, then she would need a letter from her doctor. Will  provide letter after delivery - will provide letter depending on type of delivery.    Preterm labor symptoms and general obstetric precautions including but not limited to vaginal bleeding, contractions, leaking of fluid and fetal movement were reviewed in detail with the patient. Please refer to After Visit Summary for other counseling recommendations.   No follow-ups on file.  Future Appointments  Date Time Provider [place]  04/07/2022 11:15 AM Donnamae Jude, MD [facility] None  04/17/2022  2:30 PM Truett Mainland, DO [facility] None  04/24/2022  2:30 PM Truett Mainland, DO [facility] None  05/01/2022  2:50 PM Truett Mainland, DO [facility] None    Truett Mainland, DO

## 2022-04-02 ENCOUNTER — Encounter: Payer: Medicaid Other | Admitting: Family Medicine

## 2022-04-07 ENCOUNTER — Encounter: Payer: Self-pay | Admitting: General Practice

## 2022-04-07 ENCOUNTER — Ambulatory Visit (INDEPENDENT_AMBULATORY_CARE_PROVIDER_SITE_OTHER): Payer: Medicaid Other | Admitting: Family Medicine

## 2022-04-07 ENCOUNTER — Other Ambulatory Visit (HOSPITAL_COMMUNITY)
Admission: RE | Admit: 2022-04-07 | Discharge: 2022-04-07 | Disposition: A | Discharge status: Home / Self Care | Payer: Medicaid Other | Source: Ambulatory Visit | Attending: Family Medicine | Admitting: Family Medicine

## 2022-04-07 VITALS — BP 120/74 | HR 83 | Wt 203.0 lb

## 2022-04-07 DIAGNOSIS — Z3483 Encounter for supervision of other normal pregnancy, third trimester: Secondary | ICD-10-CM

## 2022-04-07 DIAGNOSIS — Z3480 Encounter for supervision of other normal pregnancy, unspecified trimester: Secondary | ICD-10-CM | POA: Insufficient documentation

## 2022-04-07 DIAGNOSIS — Z3A36 36 weeks gestation of pregnancy: Secondary | ICD-10-CM | POA: Diagnosis not present

## 2022-04-07 NOTE — Progress Notes (Signed)
    PRENATAL VISIT NOTE  Subjective:  Megan Mckay is a 27 y.o. G2P0101 at 49w4dbeing seen today for ongoing prenatal care.  She is currently monitored for the following issues for this low-risk pregnancy and has Language barrier affecting health care; History of Henoch-Schonlein purpura; Supervision of other normal pregnancy, antepartum; and Chronic cough on their problem list.  Patient reports no complaints.  Contractions: Irritability. Vag. Bleeding: None.  Movement: Present. Denies leaking of fluid.   The following portions of the patient's history were reviewed and updated as appropriate: allergies, current medications, past family history, past medical history, past social history, past surgical history and problem list.   Objective:   Vitals:   04/07/22 1116  BP: 120/74  Pulse: 83  Weight: 203 lb (92.1 kg)    Fetal Status: Fetal Heart Rate (bpm): 152 Fundal Height: 33 cm Movement: Present  Presentation: Vertex  General:  Alert, oriented and cooperative. Patient is in no acute distress.  Skin: Skin is warm and dry. No rash noted.   Cardiovascular: Normal heart rate noted  Respiratory: Normal respiratory effort, no problems with respiration noted  Abdomen: Soft, gravid, appropriate for gestational age.  Pain/Pressure: Present     Pelvic: Cervical exam performed in the presence of a chaperone        Extremities: Normal range of motion.  Edema: Trace  Mental Status: Normal mood and affect. Normal behavior. Normal judgment and thought content.   Assessment and Plan:  Pregnancy: G2P0101 at 355w4d. Supervision of other normal pregnancy, antepartum Cultures today - Strep Gp B NAA - Cervicovaginal ancillary only( [place])  Preterm labor symptoms and general obstetric precautions including but not limited to vaginal bleeding, contractions, leaking of fluid and fetal movement were reviewed in detail with the patient. Please refer to After Visit Summary for other  counseling recommendations.   Return in 1 week (on 04/14/2022).  Future Appointments  Date Time Provider [place]2/29/2024  2:30 PM StTruett MainlandDO [facility] None  04/24/2022  2:30 PM StTruett MainlandDO [facility] None  05/01/2022  2:50 PM StNehemiah SettleaTanna SavoyDO [facility] None    TaDonnamae JudeMD

## 2022-04-07 NOTE — Progress Notes (Signed)
ROB 36.4 wks  GBS, GC/CC today Concern for passing through security X-ray while pregnant

## 2022-04-08 LAB — CERVICOVAGINAL ANCILLARY ONLY
Chlamydia: NEGATIVE
Comment: NEGATIVE
Comment: NORMAL
Neisseria Gonorrhea: NEGATIVE

## 2022-04-09 ENCOUNTER — Encounter: Payer: Self-pay | Admitting: Family Medicine

## 2022-04-09 DIAGNOSIS — O99820 Streptococcus B carrier state complicating pregnancy: Secondary | ICD-10-CM | POA: Insufficient documentation

## 2022-04-09 LAB — STREP GP B NAA: Strep Gp B NAA: POSITIVE — AB

## 2022-04-10 ENCOUNTER — Encounter: Payer: Medicaid Other | Admitting: Family Medicine

## 2022-04-17 ENCOUNTER — Ambulatory Visit (INDEPENDENT_AMBULATORY_CARE_PROVIDER_SITE_OTHER): Payer: Medicaid Other | Admitting: Family Medicine

## 2022-04-17 VITALS — BP 124/75 | HR 74 | Wt 205.0 lb

## 2022-04-17 DIAGNOSIS — Z789 Other specified health status: Secondary | ICD-10-CM

## 2022-04-17 DIAGNOSIS — Z3480 Encounter for supervision of other normal pregnancy, unspecified trimester: Secondary | ICD-10-CM

## 2022-04-17 DIAGNOSIS — Z3A38 38 weeks gestation of pregnancy: Secondary | ICD-10-CM

## 2022-04-17 DIAGNOSIS — O99820 Streptococcus B carrier state complicating pregnancy: Secondary | ICD-10-CM

## 2022-04-17 NOTE — Progress Notes (Signed)
   PRENATAL VISIT NOTE  Subjective:  Megan Mckay is a 27 y.o. G2P0101 at 70w0dbeing seen today for ongoing prenatal care.  She is currently monitored for the following issues for this high-risk pregnancy and has Language barrier affecting health care; History of Henoch-Schonlein purpura; Supervision of other normal pregnancy, antepartum; Chronic cough; and Group B Streptococcus carrier, +RV culture, currently pregnant on their problem list.  Patient reports no complaints.  Contractions: Not present. Vag. Bleeding: None.  Movement: Present. Denies leaking of fluid.   The following portions of the patient's history were reviewed and updated as appropriate: allergies, current medications, past family history, past medical history, past social history, past surgical history and problem list.   Objective:   Vitals:   04/17/22 1415  BP: 124/75  Pulse: 74  Weight: 205 lb (93 kg)    Fetal Status: Fetal Heart Rate (bpm): 130 Fundal Height: 38 cm Movement: Present  Presentation: Vertex  General:  Alert, oriented and cooperative. Patient is in no acute distress.  Skin: Skin is warm and dry. No rash noted.   Cardiovascular: Normal heart rate noted  Respiratory: Normal respiratory effort, no problems with respiration noted  Abdomen: Soft, gravid, appropriate for gestational age.  Pain/Pressure: Present     Pelvic: Cervical exam performed in the presence of a chaperone Dilation: 2.5 Effacement (%): 50 Station: -3  Extremities: Normal range of motion.  Edema: None  Mental Status: Normal mood and affect. Normal behavior. Normal judgment and thought content.   Assessment and Plan:  Pregnancy: G2P0101 at 339w0d. Supervision of other normal pregnancy, antepartum FHT and FH normal  2. Group B Streptococcus carrier, +RV culture, currently pregnant Intrapartum ppx  3. Language barrier affecting health care Speaks english well  Term labor symptoms and general obstetric precautions  including but not limited to vaginal bleeding, contractions, leaking of fluid and fetal movement were reviewed in detail with the patient. Please refer to After Visit Summary for other counseling recommendations.   No follow-ups on file.  Future Appointments  Date Time Provider [place]3/08/2022  2:30 PM StTruett MainlandDO [facility] None  05/01/2022  2:50 PM StTruett MainlandDO [facility] None    JaTruett MainlandDO

## 2022-04-17 NOTE — Progress Notes (Signed)
Patient requests to be checked today. Kathrene Alu RN

## 2022-04-24 ENCOUNTER — Encounter: Payer: Self-pay | Admitting: General Practice

## 2022-04-24 ENCOUNTER — Ambulatory Visit (INDEPENDENT_AMBULATORY_CARE_PROVIDER_SITE_OTHER): Payer: Medicaid Other | Admitting: Family Medicine

## 2022-04-24 VITALS — BP 118/68 | HR 75 | Wt 206.0 lb

## 2022-04-24 DIAGNOSIS — Z3480 Encounter for supervision of other normal pregnancy, unspecified trimester: Secondary | ICD-10-CM

## 2022-04-24 DIAGNOSIS — O99820 Streptococcus B carrier state complicating pregnancy: Secondary | ICD-10-CM

## 2022-04-24 DIAGNOSIS — Z3A39 39 weeks gestation of pregnancy: Secondary | ICD-10-CM

## 2022-04-24 NOTE — Progress Notes (Signed)
   PRENATAL VISIT NOTE  Subjective:  Megan Mckay is a 27 y.o. G2P0101 at 39w0dbeing seen today for ongoing prenatal care.  She is currently monitored for the following issues for this low-risk pregnancy and has Language barrier affecting health care; History of Henoch-Schonlein purpura; Supervision of other normal pregnancy, antepartum; Chronic cough; and Group B Streptococcus carrier, +RV culture, currently pregnant on their problem list.  Patient reports no complaints.  Contractions: Not present. Vag. Bleeding: None.  Movement: Present. Denies leaking of fluid.   The following portions of the patient's history were reviewed and updated as appropriate: allergies, current medications, past family history, past medical history, past social history, past surgical history and problem list.   Objective:   Vitals:   04/24/22 1431  BP: 118/68  Pulse: 75  Weight: 206 lb (93.4 kg)    Fetal Status: Fetal Heart Rate (bpm): 135   Movement: Present     General:  Alert, oriented and cooperative. Patient is in no acute distress.  Skin: Skin is warm and dry. No rash noted.   Cardiovascular: Normal heart rate noted  Respiratory: Normal respiratory effort, no problems with respiration noted  Abdomen: Soft, gravid, appropriate for gestational age.  Pain/Pressure: Present (RLQ)     Pelvic: Cervical exam deferred        Extremities: Normal range of motion.  Edema: None  Mental Status: Normal mood and affect. Normal behavior. Normal judgment and thought content.   Assessment and Plan:  Pregnancy: G2P0101 at 382w0d. Supervision of other normal pregnancy, antepartum FHT and FH normal Scheduled for induction at 41 weeks. - CBC; Standing - Type and screen; Standing - RPR; Standing  2. Group B Streptococcus carrier, +RV culture, currently pregnant Intrapartum ppx.  Term labor symptoms and general obstetric precautions including but not limited to vaginal bleeding, contractions, leaking of  fluid and fetal movement were reviewed in detail with the patient. Please refer to After Visit Summary for other counseling recommendations.   Return for needs BPP next thursday or friday.  Future Appointments  Date Time Provider [place]3/14/2024  2:50 PM StTruett MainlandDO [facility] None    JaTruett MainlandDO

## 2022-05-01 ENCOUNTER — Ambulatory Visit: Payer: Medicaid Other | Admitting: General Practice

## 2022-05-01 ENCOUNTER — Ambulatory Visit (INDEPENDENT_AMBULATORY_CARE_PROVIDER_SITE_OTHER): Payer: Medicaid Other | Admitting: Family Medicine

## 2022-05-01 ENCOUNTER — Telehealth (HOSPITAL_COMMUNITY): Payer: Self-pay | Admitting: *Deleted

## 2022-05-01 ENCOUNTER — Other Ambulatory Visit (INDEPENDENT_AMBULATORY_CARE_PROVIDER_SITE_OTHER): Payer: Medicaid Other

## 2022-05-01 VITALS — BP 120/70 | HR 76 | Wt 206.0 lb

## 2022-05-01 DIAGNOSIS — Z3480 Encounter for supervision of other normal pregnancy, unspecified trimester: Secondary | ICD-10-CM

## 2022-05-01 DIAGNOSIS — O480 Post-term pregnancy: Secondary | ICD-10-CM

## 2022-05-01 DIAGNOSIS — Z3A40 40 weeks gestation of pregnancy: Secondary | ICD-10-CM | POA: Diagnosis not present

## 2022-05-01 DIAGNOSIS — O99820 Streptococcus B carrier state complicating pregnancy: Secondary | ICD-10-CM

## 2022-05-01 NOTE — Telephone Encounter (Signed)
Preadmission screen  

## 2022-05-01 NOTE — Progress Notes (Signed)
   PRENATAL VISIT NOTE  Subjective:  Megan Mckay is a 27 y.o. G2P0101 at [redacted]w[redacted]d being seen today for ongoing prenatal care.  She is currently monitored for the following issues for this low-risk pregnancy and has Language barrier affecting health care; History of Henoch-Schonlein purpura; Supervision of other normal pregnancy, antepartum; Chronic cough; and Group B Streptococcus carrier, +RV culture, currently pregnant on their problem list.  Patient reports occasional contractions.  Contractions: Not present. Vag. Bleeding: None.  Movement: Present. Denies leaking of fluid.   The following portions of the patient's history were reviewed and updated as appropriate: allergies, current medications, past family history, past medical history, past social history, past surgical history and problem list.   Objective:   Vitals:   05/01/22 1457  BP: 120/70  Pulse: 76  Weight: 206 lb (93.4 kg)    Fetal Status: Fetal Heart Rate (bpm): 130 Fundal Height: 40 cm Movement: Present  Presentation: Vertex  General:  Alert, oriented and cooperative. Patient is in no acute distress.  Skin: Skin is warm and dry. No rash noted.   Cardiovascular: Normal heart rate noted  Respiratory: Normal respiratory effort, no problems with respiration noted  Abdomen: Soft, gravid, appropriate for gestational age.  Pain/Pressure: Present     Pelvic: Cervical exam performed in the presence of a chaperone Dilation: 3 Effacement (%): 60 Station: -2  Extremities: Normal range of motion.  Edema: Trace  Mental Status: Normal mood and affect. Normal behavior. Normal judgment and thought content.   Assessment and Plan:  Pregnancy: G2P0101 at [redacted]w[redacted]d 1. Supervision of other normal pregnancy, antepartum Scheduled for induction next week  2. Group B Streptococcus carrier, +RV culture, currently pregnant Intrapartum ppx  Term labor symptoms and general obstetric precautions including but not limited to vaginal  bleeding, contractions, leaking of fluid and fetal movement were reviewed in detail with the patient. Please refer to After Visit Summary for other counseling recommendations.   No follow-ups on file.  Future Appointments  Date Time Provider [place]  05/08/2022 12:00 AM [facility] [place] [facility] None  06/12/2022  1:50 PM Truett Mainland, DO [facility] None    Truett Mainland, DO

## 2022-05-01 NOTE — Progress Notes (Signed)
Pt informed that the ultrasound is considered a limited OB ultrasound and is not intended to be a complete ultrasound exam.  Patient also informed that the ultrasound is not being completed with the intent of assessing for fetal or placental anomalies or any pelvic abnormalities.  Explained that the purpose of today's ultrasound is to assess for  BPP, presentation, and AFI.  Patient acknowledges the purpose of the exam and the limitations of the study.     Koren Bound RN BSN 05/01/22

## 2022-05-02 ENCOUNTER — Encounter (HOSPITAL_COMMUNITY): Payer: Self-pay | Admitting: Obstetrics and Gynecology

## 2022-05-02 ENCOUNTER — Inpatient Hospital Stay (HOSPITAL_COMMUNITY): Payer: Medicaid Other | Admitting: Anesthesiology

## 2022-05-02 ENCOUNTER — Other Ambulatory Visit: Payer: Self-pay

## 2022-05-02 ENCOUNTER — Inpatient Hospital Stay (HOSPITAL_COMMUNITY)
Admission: AD | Admit: 2022-05-02 | Discharge: 2022-05-04 | DRG: 807 | Disposition: A | Discharge status: Home / Self Care | Payer: Medicaid Other | Attending: Obstetrics and Gynecology | Admitting: Obstetrics and Gynecology

## 2022-05-02 DIAGNOSIS — O480 Post-term pregnancy: Secondary | ICD-10-CM

## 2022-05-02 DIAGNOSIS — O99824 Streptococcus B carrier state complicating childbirth: Secondary | ICD-10-CM | POA: Diagnosis present

## 2022-05-02 DIAGNOSIS — O164 Unspecified maternal hypertension, complicating childbirth: Secondary | ICD-10-CM | POA: Diagnosis not present

## 2022-05-02 DIAGNOSIS — Z3480 Encounter for supervision of other normal pregnancy, unspecified trimester: Principal | ICD-10-CM

## 2022-05-02 DIAGNOSIS — O135 Gestational [pregnancy-induced] hypertension without significant proteinuria, complicating the puerperium: Secondary | ICD-10-CM

## 2022-05-02 DIAGNOSIS — O165 Unspecified maternal hypertension, complicating the puerperium: Secondary | ICD-10-CM

## 2022-05-02 DIAGNOSIS — Z3A40 40 weeks gestation of pregnancy: Secondary | ICD-10-CM

## 2022-05-02 DIAGNOSIS — O99820 Streptococcus B carrier state complicating pregnancy: Secondary | ICD-10-CM

## 2022-05-02 LAB — CBC
HCT: 35.3 % — ABNORMAL LOW (ref 36.0–46.0)
Hemoglobin: 11.2 g/dL — ABNORMAL LOW (ref 12.0–15.0)
MCH: 28.1 pg (ref 26.0–34.0)
MCHC: 31.7 g/dL (ref 30.0–36.0)
MCV: 88.5 fL (ref 80.0–100.0)
Platelets: 280 10*3/uL (ref 150–400)
RBC: 3.99 MIL/uL (ref 3.87–5.11)
RDW: 14.6 % (ref 11.5–15.5)
WBC: 12.8 10*3/uL — ABNORMAL HIGH (ref 4.0–10.5)
nRBC: 0 % (ref 0.0–0.2)

## 2022-05-02 LAB — TYPE AND SCREEN
ABO/RH(D): O POS
Antibody Screen: NEGATIVE

## 2022-05-02 LAB — RPR: RPR Ser Ql: NONREACTIVE

## 2022-05-02 MED ORDER — ONDANSETRON HCL 4 MG PO TABS: 4.0000 mg | ORAL_TABLET | ORAL | Status: DC | PRN

## 2022-05-02 MED ORDER — ACETAMINOPHEN 325 MG PO TABS
650.0000 mg | ORAL_TABLET | ORAL | Status: DC | PRN
  Administered 2022-05-02 – 2022-05-03 (×2): 650 mg via ORAL
  Filled 2022-05-02 (×2): qty 2

## 2022-05-02 MED ORDER — EPHEDRINE 5 MG/ML INJ: 10.0000 mg | INTRAVENOUS | Status: DC | PRN

## 2022-05-02 MED ORDER — OXYCODONE-ACETAMINOPHEN 5-325 MG PO TABS: 1.0000 | ORAL_TABLET | ORAL | Status: DC | PRN

## 2022-05-02 MED ORDER — FERROUS SULFATE 325 (65 FE) MG PO TABS
325.0000 mg | ORAL_TABLET | ORAL | Status: DC
  Administered 2022-05-02 – 2022-05-04 (×2): 325 mg via ORAL
  Filled 2022-05-02 (×2): qty 1

## 2022-05-02 MED ORDER — LIDOCAINE HCL (PF) 1 % IJ SOLN: 30.0000 mL | INTRAMUSCULAR | Status: DC | PRN

## 2022-05-02 MED ORDER — DIBUCAINE (PERIANAL) 1 % EX OINT: 1.0000 | TOPICAL_OINTMENT | CUTANEOUS | Status: DC | PRN

## 2022-05-02 MED ORDER — BENZOCAINE-MENTHOL 20-0.5 % EX AERO: 1.0000 | INHALATION_SPRAY | CUTANEOUS | Status: DC | PRN

## 2022-05-02 MED ORDER — DIPHENHYDRAMINE HCL 25 MG PO CAPS: 25.0000 mg | ORAL_CAPSULE | Freq: Four times a day (QID) | ORAL | Status: DC | PRN

## 2022-05-02 MED ORDER — WITCH HAZEL-GLYCERIN EX PADS: 1.0000 | MEDICATED_PAD | CUTANEOUS | Status: DC | PRN

## 2022-05-02 MED ORDER — METHYLERGONOVINE MALEATE 0.2 MG/ML IJ SOLN: 0.2000 mg | INTRAMUSCULAR | Status: DC | PRN

## 2022-05-02 MED ORDER — PHENYLEPHRINE 80 MCG/ML (10ML) SYRINGE FOR IV PUSH (FOR BLOOD PRESSURE SUPPORT): 80.0000 ug | PREFILLED_SYRINGE | INTRAVENOUS | Status: DC | PRN

## 2022-05-02 MED ORDER — SENNOSIDES-DOCUSATE SODIUM 8.6-50 MG PO TABS
2.0000 | ORAL_TABLET | ORAL | Status: DC
  Administered 2022-05-02 – 2022-05-04 (×3): 2 via ORAL
  Filled 2022-05-02 (×4): qty 2

## 2022-05-02 MED ORDER — SIMETHICONE 80 MG PO CHEW: 80.0000 mg | CHEWABLE_TABLET | ORAL | Status: DC | PRN

## 2022-05-02 MED ORDER — MEDROXYPROGESTERONE ACETATE 150 MG/ML IM SUSP: 150.0000 mg | INTRAMUSCULAR | Status: DC | PRN

## 2022-05-02 MED ORDER — SODIUM CHLORIDE 0.9 % IV SOLN
5.0000 10*6.[IU] | Freq: Once | INTRAVENOUS | Status: AC
  Administered 2022-05-02: 5 10*6.[IU] via INTRAVENOUS
  Filled 2022-05-02: qty 5

## 2022-05-02 MED ORDER — OXYCODONE-ACETAMINOPHEN 5-325 MG PO TABS: 2.0000 | ORAL_TABLET | ORAL | Status: DC | PRN

## 2022-05-02 MED ORDER — IBUPROFEN 600 MG PO TABS
600.0000 mg | ORAL_TABLET | Freq: Four times a day (QID) | ORAL | Status: DC
  Administered 2022-05-02 – 2022-05-04 (×10): 600 mg via ORAL
  Filled 2022-05-02 (×10): qty 1

## 2022-05-02 MED ORDER — ONDANSETRON HCL 4 MG/2ML IJ SOLN: 4.0000 mg | INTRAMUSCULAR | Status: DC | PRN

## 2022-05-02 MED ORDER — DIPHENHYDRAMINE HCL 50 MG/ML IJ SOLN: 12.5000 mg | INTRAMUSCULAR | Status: DC | PRN

## 2022-05-02 MED ORDER — OXYTOCIN BOLUS FROM INFUSION
333.0000 mL | Freq: Once | INTRAVENOUS | Status: AC
  Administered 2022-05-02: 333 mL via INTRAVENOUS

## 2022-05-02 MED ORDER — TETANUS-DIPHTH-ACELL PERTUSSIS 5-2.5-18.5 LF-MCG/0.5 IM SUSY: 0.5000 mL | PREFILLED_SYRINGE | Freq: Once | INTRAMUSCULAR | Status: DC

## 2022-05-02 MED ORDER — METHYLERGONOVINE MALEATE 0.2 MG PO TABS: 0.2000 mg | ORAL_TABLET | ORAL | Status: DC | PRN

## 2022-05-02 MED ORDER — SOD CITRATE-CITRIC ACID 500-334 MG/5ML PO SOLN: 30.0000 mL | ORAL | Status: DC | PRN

## 2022-05-02 MED ORDER — FLEET ENEMA 7-19 GM/118ML RE ENEM: 1.0000 | ENEMA | Freq: Every day | RECTAL | Status: DC | PRN

## 2022-05-02 MED ORDER — LACTATED RINGERS IV SOLN: 500.0000 mL | INTRAVENOUS | Status: DC | PRN

## 2022-05-02 MED ORDER — FENTANYL-BUPIVACAINE-NACL 0.5-0.125-0.9 MG/250ML-% EP SOLN
12.0000 mL/h | EPIDURAL | Status: DC | PRN
  Administered 2022-05-02: 12 mL/h via EPIDURAL

## 2022-05-02 MED ORDER — ACETAMINOPHEN 325 MG PO TABS: 650.0000 mg | ORAL_TABLET | ORAL | Status: DC | PRN

## 2022-05-02 MED ORDER — LACTATED RINGERS IV SOLN
500.0000 mL | Freq: Once | INTRAVENOUS | Status: AC
  Administered 2022-05-02: 500 mL via INTRAVENOUS

## 2022-05-02 MED ORDER — COCONUT OIL OIL
1.0000 | TOPICAL_OIL | Status: DC | PRN
  Administered 2022-05-03: 1 via TOPICAL

## 2022-05-02 MED ORDER — ONDANSETRON HCL 4 MG/2ML IJ SOLN: 4.0000 mg | Freq: Four times a day (QID) | INTRAMUSCULAR | Status: DC | PRN

## 2022-05-02 MED ORDER — LIDOCAINE HCL (PF) 1 % IJ SOLN
INTRAMUSCULAR | Status: DC | PRN
  Administered 2022-05-02: 10 mL via EPIDURAL

## 2022-05-02 MED ORDER — FENTANYL-BUPIVACAINE-NACL 0.5-0.125-0.9 MG/250ML-% EP SOLN
EPIDURAL | Status: AC
  Filled 2022-05-02: qty 250

## 2022-05-02 MED ORDER — FENTANYL CITRATE (PF) 100 MCG/2ML IJ SOLN
100.0000 ug | INTRAMUSCULAR | Status: DC | PRN
  Administered 2022-05-02: 100 ug via INTRAVENOUS
  Filled 2022-05-02 (×2): qty 2

## 2022-05-02 MED ORDER — MEASLES, MUMPS & RUBELLA VAC IJ SOLR: 0.5000 mL | Freq: Once | INTRAMUSCULAR | Status: DC

## 2022-05-02 MED ORDER — OXYTOCIN-SODIUM CHLORIDE 30-0.9 UT/500ML-% IV SOLN
2.5000 [IU]/h | INTRAVENOUS | Status: DC
  Administered 2022-05-02: 2.5 [IU]/h via INTRAVENOUS
  Filled 2022-05-02: qty 500

## 2022-05-02 MED ORDER — PRENATAL MULTIVITAMIN CH
1.0000 | ORAL_TABLET | Freq: Every day | ORAL | Status: DC
  Administered 2022-05-02 – 2022-05-04 (×3): 1 via ORAL
  Filled 2022-05-02 (×3): qty 1

## 2022-05-02 MED ORDER — BISACODYL 10 MG RE SUPP: 10.0000 mg | Freq: Every day | RECTAL | Status: DC | PRN

## 2022-05-02 MED ORDER — PENICILLIN G POT IN DEXTROSE 60000 UNIT/ML IV SOLN: 3.0000 10*6.[IU] | INTRAVENOUS | Status: DC

## 2022-05-02 MED ORDER — LACTATED RINGERS IV SOLN: INTRAVENOUS | Status: DC

## 2022-05-02 MED ORDER — FENTANYL-BUPIVACAINE-NACL 0.5-0.125-0.9 MG/250ML-% EP SOLN: 12.0000 mL/h | EPIDURAL | Status: DC | PRN

## 2022-05-02 NOTE — Discharge Summary (Addendum)
Postpartum Discharge Summary     Patient Name: Megan Mckay DOB: January 14, 1996 MRN: [id number]  Date of admission: 05/02/2022 Delivery date:05/02/2022  Delivering provider: Christin Fudge  Date of discharge: 05/04/2022  Admitting diagnosis: Indication for care in labor or delivery [O75.9] Intrauterine pregnancy: [redacted]w[redacted]d     Secondary diagnosis:  Principal Problem:   Vaginal delivery Active Problems:   Postpartum hypertension  Additional problems: none    Discharge diagnosis: Term Pregnancy Delivered                                              Post partum procedures: none Augmentation: none Complications: None  Hospital course: Onset of Labor With Vaginal Delivery      27 y.o. yo G2P0101 at [redacted]w[redacted]d was admitted in Active Labor on 05/02/2022. Labor course was complicated by inadequate GBS ppx  Membrane Rupture Time/Date: 2:36 AM ,05/02/2022   Delivery Method:Vaginal, Spontaneous  Episiotomy:   Lacerations:  None  Patient had a postpartum course complicated by elevated Bps. She was started on daily furosemide for 5 days and set up with baby script at the time of discharge.   She is ambulating, tolerating a regular diet, passing flatus, and urinating well. Patient is discharged home in stable condition on 05/04/22.  Newborn Data: Birth date:05/02/2022  Birth time:3:08 AM  Gender:Female  Living status:Living  Apgars:8 ,9  Weight:3260 g   Magnesium Sulfate received: No BMZ received: No Rhophylac:N/A MMR:N/A T-DaP:Given prenatally Flu: No Transfusion:No  Physical exam  Vitals:   05/03/22 2100 05/04/22 0619 05/04/22 0736 05/04/22 0739  BP: 131/78 (!) 129/91 128/88 122/83  Pulse: 84 70 71   Resp: 16     Temp: 98 F (36.7 C) 97.6 F (36.4 C)    TempSrc: Oral Axillary    SpO2: 99% 100% 100%   Weight:      Height:       General: alert, cooperative, and no distress Lochia: appropriate Uterine Fundus: firm at umbilicus Incision: N/A DVT Evaluation: No  evidence of DVT seen on physical exam. Labs: Lab Results  Component Value Date   WBC 12.8 (H) 05/02/2022   HGB 11.2 (L) 05/02/2022   HCT 35.3 (L) 05/02/2022   MCV 88.5 05/02/2022   PLT 280 05/02/2022      Latest Ref Rng & Units 12/19/2016    9:16 AM  CMP  Glucose 65 - 99 mg/dL 69   BUN 6 - 20 mg/dL 8   Creatinine 0.57 - 1.00 mg/dL 0.50   Sodium 134 - 144 mmol/L 140   Potassium 3.5 - 5.2 mmol/L 4.4   Chloride 96 - 106 mmol/L 104   CO2 20 - 29 mmol/L 21   Calcium 8.7 - 10.2 mg/dL 9.4   Total Protein 6.0 - 8.5 g/dL 7.1   Total Bilirubin 0.0 - 1.2 mg/dL <0.2   Alkaline Phos 39 - 117 IU/L 73   AST 0 - 40 IU/L 15   ALT 0 - 32 IU/L 8    Edinburgh Score:    05/02/2022    7:04 AM  Edinburgh Postnatal Depression Scale Screening Tool  I have been able to laugh and see the funny side of things. 0  I have looked forward with enjoyment to things. 0  I have blamed myself unnecessarily when things went wrong. 0  I have been anxious or worried for no good  reason. 1  I have felt scared or panicky for no good reason. 1  Things have been getting on top of me. 1  I have been so unhappy that I have had difficulty sleeping. 0  I have felt sad or miserable. 1  I have been so unhappy that I have been crying. 1  The thought of harming myself has occurred to me. 0  Edinburgh Postnatal Depression Scale Total 5     After visit meds:  Allergies as of 05/04/2022   No Known Allergies      Medication List     TAKE these medications    benzonatate 100 MG capsule Commonly known as: TESSALON Take 1 capsule (100 mg total) by mouth every 8 (eight) hours.   famotidine 20 MG tablet Commonly known as: PEPCID Take 1 tablet (20 mg total) by mouth 2 (two) times daily.   furosemide 20 MG tablet Commonly known as: LASIX Take 1 tablet (20 mg total) by mouth daily.   multivitamin-prenatal 27-0.8 MG Tabs tablet Take 1 tablet by mouth daily at 12 noon.         Discharge home in stable  condition Infant Feeding: Bottle and Breast Infant Disposition:home with mother Discharge instruction: per After Visit Summary and Postpartum booklet. Activity: Advance as tolerated. Pelvic rest for 6 weeks.  Diet: routine diet Future Appointments: Future Appointments  Date Time Provider [place]  06/12/2022  1:50 PM Truett Mainland, DO [facility] None   Follow up Visit:   Please schedule this patient for a In person postpartum visit in 4 weeks with the following provider: Any provider. Additional Postpartum F/U:  Low risk pregnancy complicated by:  Delivery mode:  Vaginal, Spontaneous  Anticipated Birth Control:   declines  Megan Mckay, Megan Mckay 05/04/2022 7:52 AM   Fellow Attestation  I saw and evaluated the patient, performing the key elements of the service.I  personally performed or re-performed the history, physical exam, and medical decision making activities of this service and have verified that the service and findings are accurately documented in the resident's note. I developed the management plan that is described in the resident's note, and I agree with the content, with my edits above.    Gifford Shave, MD OB Fellow

## 2022-05-02 NOTE — Anesthesia Procedure Notes (Signed)
Epidural Patient location during procedure: OB Start time: 05/02/2022 2:50 AM End time: 05/02/2022 2:55 AM  Staffing Anesthesiologist: Janeece Riggers, MD  Preanesthetic Checklist Completed: patient identified, IV checked, site marked, risks and benefits discussed, surgical consent, monitors and equipment checked, pre-op evaluation and timeout performed  Epidural Patient position: sitting Prep: DuraPrep and site prepped and draped Patient monitoring: continuous pulse ox and blood pressure Approach: midline Location: L3-L4 Injection technique: LOR air  Needle:  Needle type: Tuohy  Needle gauge: 17 G Needle length: 9 cm and 9 Needle insertion depth: 7 cm Catheter type: closed end flexible Catheter size: 19 Gauge Catheter at skin depth: 13 cm Test dose: negative  Assessment Events: blood not aspirated, no cerebrospinal fluid, injection not painful, no injection resistance, no paresthesia and negative IV test

## 2022-05-02 NOTE — Plan of Care (Signed)

## 2022-05-02 NOTE — Anesthesia Postprocedure Evaluation (Signed)
Anesthesia Post Note  Patient: Aydriana Kostrzewa  Procedure(s) Performed: AN AD HOC LABOR EPIDURAL     Patient location during evaluation: Mother Baby Anesthesia Type: Epidural Level of consciousness: awake and alert Pain management: pain level controlled Vital Signs Assessment: post-procedure vital signs reviewed and stable Respiratory status: spontaneous breathing, nonlabored ventilation and respiratory function stable Cardiovascular status: stable Postop Assessment: no headache, no backache and epidural receding Anesthetic complications: no   No notable events documented.  Last Vitals:  Vitals:   05/02/22 0500 05/02/22 0605  BP: 118/77 125/82  Pulse: 63 62  Resp: 17 18  Temp:  36.9 C  SpO2:  98%    Last Pain:  Vitals:   05/02/22 0605  TempSrc: Oral  PainSc:    Pain Goal: Patients Stated Pain Goal: 0 (05/02/22 0227)              Epidural/Spinal Function Cutaneous sensation: Normal sensation (05/02/22 [id number]), Patient able to flex knees: Yes (05/02/22 [id number]), Patient able to lift hips off bed: Yes (05/02/22 0605), Back pain beyond tenderness at insertion site: No (05/02/22 0605), Progressively worsening motor and/or sensory loss: No (05/02/22 [id number]), Bowel and/or bladder incontinence post epidural: No (05/02/22 [id number])  Zunairah Devers

## 2022-05-02 NOTE — MAU Note (Signed)
.  Megan Mckay is a 27 y.o. at [redacted]w[redacted]d here in MAU reporting ctxs since 2000. Denies LOF. Some bloody show. Reports good FM. 3cm yesterday at office visit.  Onset of complaint: 2000 Pain score: 5 Vitals:   05/02/22 0036 05/02/22 0037  BP:  (!) 142/99  Pulse: 67   Resp: 17   Temp: 97.9 F (36.6 C)   SpO2: 100%      FHT:130 Lab orders placed from triage: labor eval

## 2022-05-02 NOTE — Plan of Care (Signed)

## 2022-05-02 NOTE — Anesthesia Preprocedure Evaluation (Signed)
Anesthesia Evaluation  Patient identified by MRN, date of birth, ID band Patient awake    Reviewed: Allergy & Precautions, H&P , NPO status , Patient's Chart, lab work & pertinent test results, reviewed documented beta blocker date and time   Airway Mallampati: III  TM Distance: >3 FB Neck ROM: full    Dental no notable dental hx. (+) Teeth Intact, Dental Advisory Given   Pulmonary neg pulmonary ROS   Pulmonary exam normal breath sounds clear to auscultation       Cardiovascular negative cardio ROS Normal cardiovascular exam Rhythm:regular Rate:Normal     Neuro/Psych negative neurological ROS  negative psych ROS   GI/Hepatic negative GI ROS, Neg liver ROS,,,  Endo/Other  negative endocrine ROS    Renal/GU negative Renal ROS  negative genitourinary   Musculoskeletal   Abdominal   Peds  Hematology negative hematology ROS (+)   Anesthesia Other Findings   Reproductive/Obstetrics (+) Pregnancy                             Anesthesia Physical Anesthesia Plan  ASA: 2  Anesthesia Plan: Epidural   Post-op Pain Management:    Induction:   PONV Risk Score and Plan:   Airway Management Planned: Natural Airway  Additional Equipment: None  Intra-op Plan:   Post-operative Plan:   Informed Consent: I have reviewed the patients History and Physical, chart, labs and discussed the procedure including the risks, benefits and alternatives for the proposed anesthesia with the patient or authorized representative who has indicated his/her understanding and acceptance.     Dental Advisory Given  Plan Discussed with: Anesthesiologist  Anesthesia Plan Comments: (Labs checked- platelets confirmed with RN in room. Fetal heart tracing, per RN, reported to be stable enough for sitting procedure. Discussed epidural, and patient consents to the procedure:  included risk of possible headache,backache,  failed block, allergic reaction, and nerve injury. This patient was asked if she had any questions or concerns before the procedure started.)       Anesthesia Quick Evaluation

## 2022-05-02 NOTE — H&P (Signed)
Megan Mckay is a 27 y.o. female G15P0101 with IUP at [redacted]w[redacted]d by LMP presenting for contractions since 2000.  Was 3cms in office yesterday.  She reports positive fetal movement. She denies leakage of fluid or vaginal bleeding.  Prenatal History/Complications: [facility] Pregnancy complications:  - Past Medical History: Past Medical History:  Diagnosis Date   History of Henoch-Schonlein purpura     Past Surgical History: Past Surgical History:  Procedure Laterality Date   NO PAST SURGERIES      Obstetrical History: OB History     Gravida  2   Para  1   Term      Preterm  1   AB      Living  1      SAB      IAB      Ectopic      Multiple      Live Births  1            Social History: Social History   Socioeconomic History   Marital status: Married    Spouse name: Not on file   Number of children: Not on file   Years of education: Not on file   Highest education level: Not on file  Occupational History   Not on file  Tobacco Use   Smoking status: Never   Smokeless tobacco: Never  Vaping Use   Vaping Use: Never used  Substance and Sexual Activity   Alcohol use: No   Drug use: No   Sexual activity: Yes    Birth control/protection: None  Other Topics Concern   Not on file  Social History Narrative   Not on file   Social Determinants of Health   Financial Resource Strain: Not on file  Food Insecurity: Not on file  Transportation Needs: Not on file  Physical Activity: Not on file  Stress: Not on file  Social Connections: Not on file    Family History: Family History  Problem Relation Age of Onset   Asthma Sister    Cancer Neg Hx    Hypertension Neg Hx    Birth defects Neg Hx    Heart disease Neg Hx    Diabetes Neg Hx    Stroke Neg Hx     Allergies: No Known Allergies  Medications Prior to Admission  Medication Sig Dispense Refill Last Dose   Prenatal Vit-Fe Fumarate-FA (MULTIVITAMIN-PRENATAL) 27-0.8 MG TABS  tablet Take 1 tablet by mouth daily at 12 noon.   Past Month   benzonatate (TESSALON) 100 MG capsule Take 1 capsule (100 mg total) by mouth every 8 (eight) hours. (Patient not taking: Reported on 03/06/2022) 21 capsule 0    famotidine (PEPCID) 20 MG tablet Take 1 tablet (20 mg total) by mouth 2 (two) times daily. (Patient not taking: Reported on 03/06/2022) 60 tablet 3     Review of Systems   Constitutional: Negative for fever and chills Eyes: Negative for visual disturbances Respiratory: Negative for shortness of breath, dyspnea Cardiovascular: Negative for chest pain or palpitations  Gastrointestinal: Negative for vomiting, diarrhea and constipation.  POSITIVE for abdominal pain (contractions) Genitourinary: Negative for dysuria and urgency Musculoskeletal: Negative for back pain, joint pain, myalgias  Neurological: Negative for dizziness and headaches  Blood pressure 133/72, pulse 71, temperature 97.9 F (36.6 C), resp. rate 17, height 5\' 5"  (1.651 m), weight 94.3 kg, last menstrual period 07/25/2021, SpO2 100 %, unknown if currently breastfeeding. General appearance: alert, cooperative, and mild distress Lungs:  normal respiratory effort Heart: regular rate and rhythm Abdomen: soft, non-tender; bowel sounds normal Extremities: Homans sign is negative, no sign of DVT DTR's 2+ Presentation: cephalic Fetal monitoring  Baseline: 125 bpm, moderate variability, + accels, no decels Uterine activity  q 2-3 minutes Dilation: 5.5 Effacement (%): 70 Station: -2 Exam by:: Donzetta Sprung, RN   Prenatal labs: ABO, Rh: O/Positive/-- (08/23 1438) Antibody: Negative (08/23 1438) Rubella: 3.92 (08/23 1438) RPR: Non Reactive (12/22 0839)  HBsAg: Negative (08/23 1438)  HIV: Non Reactive (12/22 0839)  GBS: Positive/-- (02/19 1136)   Nursing Staff Provider  Office Location [facility]  Dating  LMP  [facility] Model [x]  Traditional [ ]  Centering [ ]  Mom-Baby Dyad    Language  English  Anatomy US  normal   Flu Vaccine  Declined 1/4 Genetic/Carrier Screen  NIPS:   LR XY AFP:    Horizon: Negative  TDaP Vaccine   03/06/2022 Hgb A1C or  GTT Early  Third trimester nl 2 hour  COVID Vaccine    LAB RESULTS   Rhogam  N/a Blood Type O/Positive/-- (08/23 1438)   Baby Feeding Plan Breast and Bottle  Antibody Negative (08/23 1438)  Contraception none Rubella 3.92 (08/23 1438)  Circumcision Unsure  RPR Non Reactive (08/23 1438)   Pediatrician  [facility] HBsAg Negative (08/23 1438)   Support Person Walter(FOB)  HCVAb Negative  Prenatal Classes  HIV Non Reactive (08/23 1438)     BTL Consent  GBS  Positive  VBAC Consent  Pap NILM 09/19/21       DME Rx [ ]  BP cuff [ ]  Weight Scale Waterbirth  [ ]  Class [ ]  Consent [ ]  CNM visit  PHQ9 & GAD7 [  ] new OB [  ] 28 weeks  [  ] 36 weeks Induction  [ ]  Orders Entered [ ] Foley Y/N    Prenatal Transfer Tool  Maternal Diabetes: No Genetic Screening: Normal Maternal Ultrasounds/Referrals: Normal Fetal Ultrasounds or other Referrals:  None Maternal Substance Abuse:  No Significant Maternal Medications:  None Significant Maternal Lab Results: Group B Strep positive  No results found for this or any previous visit (from the past 24 hour(s)).  Assessment: Megan Mckay is a 27 y.o. G2P0101 with an IUP at [redacted]w[redacted]d presenting for labor  Plan: #Labor: expectant management #Pain:  Per request #FWB Cat 1 #ID: GBS: PCN     Christin Fudge 05/02/2022, 1:12 AM

## 2022-05-02 NOTE — Plan of Care (Signed)
Problem: Education: Goal: Knowledge of Childbirth will improve 05/02/2022 0457 by Bonner Puna, RN Outcome: Completed/Met 05/02/2022 0203 by Bonner Puna, RN Outcome: Progressing Goal: Ability to make informed decisions regarding treatment and plan of care will improve 05/02/2022 0457 by Bonner Puna, RN Outcome: Completed/Met 05/02/2022 0203 by Bonner Puna, RN Outcome: Progressing Goal: Ability to state and carry out methods to decrease the pain will improve 05/02/2022 0457 by Bonner Puna, RN Outcome: Completed/Met 05/02/2022 0203 by Bonner Puna, RN Outcome: Progressing Goal: Individualized Educational Video(s) 05/02/2022 0457 by Bonner Puna, RN Outcome: Completed/Met 05/02/2022 0203 by Bonner Puna, RN Outcome: Progressing   Problem: Coping: Goal: Ability to verbalize concerns and feelings about labor and delivery will improve 05/02/2022 0457 by Bonner Puna, RN Outcome: Completed/Met 05/02/2022 0203 by Bonner Puna, RN Outcome: Progressing   Problem: Life Cycle: Goal: Ability to make normal progression through stages of labor will improve 05/02/2022 0457 by Bonner Puna, RN Outcome: Completed/Met 05/02/2022 0203 by Bonner Puna, RN Outcome: Progressing Goal: Ability to effectively push during vaginal delivery will improve 05/02/2022 0457 by Bonner Puna, RN Outcome: Completed/Met 05/02/2022 0203 by Bonner Puna, RN Outcome: Progressing   Problem: Role Relationship: Goal: Will demonstrate positive interactions with the child 05/02/2022 0457 by Bonner Puna, RN Outcome: Completed/Met 05/02/2022 0203 by Bonner Puna, RN Outcome: Progressing   Problem: Safety: Goal: Risk of complications during labor and delivery will decrease 05/02/2022 0457 by Bonner Puna, RN Outcome: Completed/Met 05/02/2022 0203 by Bonner Puna, RN Outcome: Progressing   Problem:  Pain Management: Goal: Relief or control of pain from uterine contractions will improve 05/02/2022 0457 by Bonner Puna, RN Outcome: Completed/Met 05/02/2022 0203 by Bonner Puna, RN Outcome: Progressing   Problem: Education: Goal: Knowledge of General Education information will improve Description: Including pain rating scale, medication(s)/side effects and non-pharmacologic comfort measures 05/02/2022 0457 by Bonner Puna, RN Outcome: Completed/Met 05/02/2022 0203 by Bonner Puna, RN Outcome: Progressing   Problem: Health Behavior/Discharge Planning: Goal: Ability to manage health-related needs will improve 05/02/2022 0457 by Bonner Puna, RN Outcome: Completed/Met 05/02/2022 0203 by Bonner Puna, RN Outcome: Progressing   Problem: Clinical Measurements: Goal: Ability to maintain clinical measurements within normal limits will improve 05/02/2022 0457 by Bonner Puna, RN Outcome: Completed/Met 05/02/2022 0203 by Bonner Puna, RN Outcome: Progressing Goal: Will remain free from infection 05/02/2022 0457 by Bonner Puna, RN Outcome: Completed/Met 05/02/2022 0203 by Bonner Puna, RN Outcome: Progressing Goal: Diagnostic test results will improve 05/02/2022 0457 by Bonner Puna, RN Outcome: Completed/Met 05/02/2022 0203 by Bonner Puna, RN Outcome: Progressing Goal: Respiratory complications will improve 05/02/2022 0457 by Bonner Puna, RN Outcome: Completed/Met 05/02/2022 0203 by Bonner Puna, RN Outcome: Progressing Goal: Cardiovascular complication will be avoided 05/02/2022 0457 by Bonner Puna, RN Outcome: Completed/Met 05/02/2022 0203 by Bonner Puna, RN Outcome: Progressing   Problem: Activity: Goal: Risk for activity intolerance will decrease 05/02/2022 0457 by Bonner Puna, RN Outcome: Completed/Met 05/02/2022 0203 by Bonner Puna, RN Outcome:  Progressing   Problem: Nutrition: Goal: Adequate nutrition will be maintained 05/02/2022 0457 by Bonner Puna, RN Outcome: Completed/Met 05/02/2022 0203 by Bonner Puna, RN Outcome: Progressing   Problem: Coping: Goal: Level of anxiety will decrease 05/02/2022 0457 by Bonner Puna, RN Outcome: Completed/Met 05/02/2022 0203 by Bonner Puna, RN Outcome: Progressing   Problem: Elimination:  Goal: Will not experience complications related to bowel motility 05/02/2022 0457 by Bonner Puna, RN Outcome: Completed/Met 05/02/2022 0203 by Bonner Puna, RN Outcome: Progressing Goal: Will not experience complications related to urinary retention 05/02/2022 0457 by Bonner Puna, RN Outcome: Completed/Met 05/02/2022 0203 by Bonner Puna, RN Outcome: Progressing   Problem: Pain Managment: Goal: General experience of comfort will improve 05/02/2022 0457 by Bonner Puna, RN Outcome: Completed/Met 05/02/2022 0203 by Bonner Puna, RN Outcome: Progressing   Problem: Safety: Goal: Ability to remain free from injury will improve 05/02/2022 0457 by Bonner Puna, RN Outcome: Completed/Met 05/02/2022 0203 by Bonner Puna, RN Outcome: Progressing   Problem: Skin Integrity: Goal: Risk for impaired skin integrity will decrease 05/02/2022 0457 by Bonner Puna, RN Outcome: Completed/Met 05/02/2022 0203 by Bonner Puna, RN Outcome: Progressing   Problem: Education: Goal: Knowledge of condition will improve Outcome: Completed/Met Goal: Individualized Educational Video(s) Outcome: Completed/Met Goal: Individualized Newborn Educational Video(s) Outcome: Completed/Met   Problem: Activity: Goal: Will verbalize the importance of balancing activity with adequate rest periods Outcome: Completed/Met Goal: Ability to tolerate increased activity will improve Outcome: Completed/Met   Problem: Coping: Goal: Ability  to identify and utilize available resources and services will improve Outcome: Completed/Met   Problem: Life Cycle: Goal: Chance of risk for complications during the postpartum period will decrease Outcome: Completed/Met   Problem: Role Relationship: Goal: Ability to demonstrate positive interaction with newborn will improve Outcome: Completed/Met   Problem: Skin Integrity: Goal: Demonstration of wound healing without infection will improve Outcome: Completed/Met

## 2022-05-02 NOTE — MAU Note (Signed)
Pt informed that the ultrasound is considered a limited OB ultrasound and is not intended to be a complete ultrasound exam.  Patient also informed that the ultrasound is not being completed with the intent of assessing for fetal or placental anomalies or any pelvic abnormalities.  Explained that the purpose of today's ultrasound is to assess for  Vertex presentation.  Patient acknowledges the purpose of the exam and the limitations of the study.   Vertex presentation confirmed.

## 2022-05-03 NOTE — Progress Notes (Signed)
Circumcision Consent  Discussed with mom at bedside about circumcision.   Circumcision is a surgery that removes the skin that covers the tip of the penis, called the "foreskin." Circumcision is usually done when a boy is between 75 and 56 days old, sometimes up to 84-72 weeks old.  The most common reasons boys are circumcised include for cultural/religious beliefs or for parental preference (potentially easier to clean, so baby looks like daddy, etc).  There may be some medical benefits for circumcision:   Circumcised boys seem to have slightly lower rates of: ? Urinary tract infections (per the American Academy of Pediatrics an uncircumcised boy has a 1/100 chance of developing a UTI in the first year of life, a circumcised boy at a 02/998 chance of developing a UTI in the first year of life- a 10% reduction) ? Penis cancer (typically rare- an uncircumcised female has a 1 in 100,000 chance of developing cancer of the penis) ? Sexually transmitted infection (in endemic areas, including HIV, HPV and Herpes- circumcision does NOT protect against gonorrhea, chlamydia, trachomatis, or syphilis) ? Phimosis: a condition where that makes retraction of the foreskin over the glans impossible (0.4 per 1000 boys per year or 0.6% of boys are affected by their 15th birthday)  Boys and men who are not circumcised can reduce these extra risks by: ? Cleaning their penis well ? Using condoms during sex  What are the risks of circumcision?  As with any surgical procedure, there are risks and complications. In circumcision, complications are rare and usually minor, the most common being: ? Bleeding- risk is reduced by holding each clamp for 30 seconds prior to a cut being made, and by holding pressure after the procedure is done ? Infection- the penis is cleaned prior to the procedure, and the procedure is done under sterile technique ? Damage to the urethra or amputation of the penis  How is circumcision done  in baby boys?  The baby will be placed on a special table and the legs restrained for their safety. Numbing medication is injected into the penis, and the skin is cleansed with betadine to decrease the risk of infection.   What to expect:  The penis will look red and raw for 5-7 days as it heals. We expect scabbing around where the cut was made, as well as clear-pink fluid and some swelling of the penis right after the procedure. If your baby's circumcision starts to bleed or develops pus, please contact your pediatrician immediately.  All questions were answered and mother consented.  Megan Living, MD 11:02 AM

## 2022-05-03 NOTE — Lactation Note (Signed)
This note was copied from a baby's chart. Lactation Consultation Note  Patient Name: Megan Mckay [id number] Date: 05/03/2022 Age:26 hours Reason for consult: Initial assessment;Term;Breastfeeding assistance;Infant weight loss (2.3% WL)  In-house Spanish interpreter Lorn Junes used for the consultation.  LC entered the room and the infant was at 47 hours old.  P2 birth parent stated that things were going well with breastfeeding.  She said that the infant has been latching well and denies any problems.  The birth parent remarked that she needed assistance with pumping because she is not producing anything.  LC reassured the birth parent and provided education on milk production.  LC taught the birth parent how to hand express and no drops were noted.  LC noted a compression stripe on the birth parent's left nipple.  She stated that it was not painful.  LC encouraged the birth parent to call for breastfeeding assistance when she is ready to feed the infant again.  LC provided information on outpatient services and milk storage guidelines.  The birth parent had no further questions or concerns.   Infant Feeding Plan:  Breastfeed 8+ times per day according to feeding cues.  Put the infant to the breast prior to supplementing.  Call [place] for assistance with breastfeeding.    Maternal Data Has patient been taught Hand Expression?: Yes Does the patient have breastfeeding experience prior to this delivery?: Yes How long did the patient breastfeed?: 3 months  Feeding Mother's Current Feeding Choice: Breast Milk and Formula Nipple Type: Slow - flow  LATCH Score                    Lactation Tools Discussed/Used    Interventions Interventions: LC Services brochure;Breast feeding basics reviewed;Education  Discharge Pump: Personal;Manual  Consult Status Consult Status: Follow-up Date: 05/04/22 Follow-up type: In-patient    Elly Modena Collis Thede 05/03/2022, 8:10  AM

## 2022-05-03 NOTE — Progress Notes (Addendum)
POSTPARTUM PROGRESS NOTE  Subjective: Megan Mckay is a 27 y.o. [id number] s/p SVD at [redacted]w[redacted]d.  She reports she doing well. No acute events overnight. She denies any problems with ambulating, voiding or po intake. Denies nausea or vomiting. She has passed flatus. Pain is well controlled.  Lochia is improving and appropriate.  Objective: Blood pressure 117/81, pulse 75, temperature 97.8 F (36.6 C), temperature source Oral, resp. rate 16, height 5\' 5"  (1.651 m), weight 94.3 kg, last menstrual period 07/25/2021, SpO2 99 %, unknown if currently breastfeeding.  Physical Exam:  General: alert, cooperative and no distress Chest: no respiratory distress Abdomen: soft, non-tender  Uterine Fundus: firm and at level of umbilicus Extremities: No calf swelling or tenderness  no edema  Recent Labs    05/02/22 0112  HGB 11.2*  HCT 35.3*    Assessment/Plan: Megan Mckay is a 27 y.o. [id number] s/p SVD at [redacted]w[redacted]d for SOL.  Routine Postpartum Care: Doing well, pain well-controlled.  -- Continue routine care, lactation support  -- Contraception: declines -- Feeding: both - Awaiting circumcision for baby (needs consent)  Dispo: Plan for discharge tomorrow.  Arlyce Dice, [place] for [facility] 05/03/2022 7:17 AM   Fellow Attestation  I saw and evaluated the patient, performing the key elements of the service.I  personally performed or re-performed the history, physical exam, and medical decision making activities of this service and have verified that the service and findings are accurately documented in the resident's note. I developed the management plan that is described in the resident's note, and I agree with the content, with my edits above.    Gifford Shave, MD OB Fellow

## 2022-05-04 DIAGNOSIS — O165 Unspecified maternal hypertension, complicating the puerperium: Secondary | ICD-10-CM | POA: Insufficient documentation

## 2022-05-04 MED ORDER — FUROSEMIDE 20 MG PO TABS: 20.0000 mg | ORAL_TABLET | Freq: Every day | ORAL | 0 refills | Status: DC

## 2022-05-04 MED ORDER — FUROSEMIDE 20 MG PO TABS: 20.0000 mg | ORAL_TABLET | Freq: Every day | ORAL | 0 refills | Status: AC

## 2022-05-04 MED ORDER — FUROSEMIDE 20 MG PO TABS
20.0000 mg | ORAL_TABLET | Freq: Every day | ORAL | Status: DC
  Administered 2022-05-04: 20 mg via ORAL
  Filled 2022-05-04: qty 1

## 2022-05-04 NOTE — Lactation Note (Signed)
This note was copied from a baby's chart. Lactation Consultation Note  Patient Name: Megan Mckay [id number] Date: 05/04/2022 Age:26 hours Reason for consult: Maternal discharge  [facility] student discharge contact with [place] speaking interpreter Graciella. LC entered the room and noted birthing parent latching baby in cradle hold on right breast with left hand supporting breast, baby was tummy to tummy swaddled and supported by one pillow. Baby appeared to be latched on comfortably, sleeping with spontaneous sucking, LC student did not notice any swallows at this time. Baby continued to nurse for about 10 minutes before birthing parent took baby off the breast. Right nipple appeared rounded and pink with no visible signs of trauma, and birthing parent denied any pain or discomfort. Discussed if there were to be any pain in the future, tips for latching baby in a deep latch, breaking seal and re offering the breast if shallow or painful latch, and using breast milk to rub into nipple to sooth the skin if sore. Birthing parent shared she was having stomach cramps while latching, and was told that was normal but wanted confirmation. Discussed why that can occur and when it should subside and encouraged to contact care provider if pain needs to be discussed or addressed with options. Birthing parent states does not currently have [place] and knows how to contact to apply and will do so. Birthing parent states she knows about engorgement from last baby, stating breasts were hard. [facility] student educated on engorgement protocol, ways to avoid, and importance of contact care provider if mastitis warning signs arise. Discussed community resources such as [employer], [employer], and how to reach out if needing patient Lactation Support at [facility]. Counseled parents on the importance of frequency offering the breast at least every 3 hours, reviewed cluster feeding, importance of offering the breast, and option to offer  both at each feeding, as well as output expectations for baby, and rest, nourishment, and hydration for parent. Birthing parent shared goal is to continue providing breast milk for 2-3 months.   Maternal Data Does the patient have breastfeeding experience prior to this delivery?: Yes How long did the patient breastfeed?: 3 months  Feeding Mother's Current Feeding Choice: Breast Milk and Formula  LATCH Score Latch: Grasps breast easily, tongue down, lips flanged, rhythmical sucking.  Audible Swallowing: None (LC student did not hear any audible swallows durring discharge visit.)  Type of Nipple: Everted at rest and after stimulation  Comfort (Breast/Nipple): Soft / non-tender  Hold (Positioning): No assistance needed to correctly position infant at breast.  LATCH Score: 8     Discharge Discharge Education: Engorgement and breast care;Outpatient recommendation;Warning signs for feeding baby [place] Program: No (Birthing parent states she knows about [facility] and plans to apply, not currently on [facility].)  Consult Status Consult Status: Complete Date: 05/04/22 Follow-up type: Call as needed  Debara Pickett, BSW, Lactation Student 05/04/2022, 10:37 AM

## 2022-05-08 ENCOUNTER — Inpatient Hospital Stay (HOSPITAL_COMMUNITY): Admission: RE | Admit: 2022-05-08 | Payer: Medicaid Other | Source: Home / Self Care | Admitting: Family Medicine

## 2022-05-08 ENCOUNTER — Inpatient Hospital Stay (HOSPITAL_COMMUNITY): Payer: Medicaid Other

## 2022-05-14 ENCOUNTER — Telehealth (HOSPITAL_COMMUNITY): Payer: Self-pay | Admitting: *Deleted

## 2022-05-14 NOTE — Telephone Encounter (Signed)
Attempted Hospital Discharge Follow-Up Call with help from interpreter "Verdene Lennert [phone]".  Interpreter left voice mail requesting that patient return RN's phone call if patient has any concerns or questions.

## 2022-06-12 ENCOUNTER — Ambulatory Visit: Payer: Medicaid Other | Admitting: Family Medicine
